# Patient Record
Sex: Female | Born: 1980
Health system: Southern US, Community
[De-identification: ages and names within clinical notes are randomized; demographics above are authoritative.]

## PROBLEM LIST (undated history)

## (undated) DIAGNOSIS — R011 Cardiac murmur, unspecified: Secondary | ICD-10-CM

## (undated) DIAGNOSIS — D649 Anemia, unspecified: Secondary | ICD-10-CM

## (undated) DIAGNOSIS — F32A Depression, unspecified: Secondary | ICD-10-CM

## (undated) DIAGNOSIS — K219 Gastro-esophageal reflux disease without esophagitis: Secondary | ICD-10-CM

## (undated) DIAGNOSIS — F329 Major depressive disorder, single episode, unspecified: Secondary | ICD-10-CM

## (undated) HISTORY — DX: Major depressive disorder, single episode, unspecified: F32.9

## (undated) HISTORY — DX: Depression, unspecified: F32.A

---

## 2000-08-09 ENCOUNTER — Other Ambulatory Visit: Admission: RE | Admit: 2000-08-09 | Discharge: 2000-08-09 | Payer: Self-pay | Admitting: Internal Medicine

## 2000-09-25 ENCOUNTER — Ambulatory Visit (HOSPITAL_COMMUNITY): Admission: RE | Admit: 2000-09-25 | Discharge: 2000-09-25 | Payer: Self-pay | Admitting: Infectious Diseases

## 2001-03-15 ENCOUNTER — Encounter: Admission: RE | Admit: 2001-03-15 | Discharge: 2001-03-15 | Payer: Self-pay | Admitting: Internal Medicine

## 2001-03-15 ENCOUNTER — Encounter: Payer: Self-pay | Admitting: Internal Medicine

## 2002-03-22 ENCOUNTER — Other Ambulatory Visit: Admission: RE | Admit: 2002-03-22 | Discharge: 2002-03-22 | Payer: Self-pay | Admitting: Internal Medicine

## 2002-12-21 ENCOUNTER — Encounter: Admission: RE | Admit: 2002-12-21 | Discharge: 2003-03-21 | Payer: Self-pay | Admitting: Occupational Medicine

## 2004-05-05 ENCOUNTER — Other Ambulatory Visit: Admission: RE | Admit: 2004-05-05 | Discharge: 2004-05-05 | Payer: Self-pay | Admitting: Obstetrics and Gynecology

## 2004-10-15 ENCOUNTER — Other Ambulatory Visit: Admission: RE | Admit: 2004-10-15 | Discharge: 2004-10-15 | Payer: Self-pay | Admitting: Obstetrics and Gynecology

## 2005-10-30 ENCOUNTER — Other Ambulatory Visit: Admission: RE | Admit: 2005-10-30 | Discharge: 2005-10-30 | Payer: Self-pay | Admitting: Obstetrics and Gynecology

## 2006-05-27 ENCOUNTER — Ambulatory Visit (HOSPITAL_COMMUNITY): Admission: RE | Admit: 2006-05-27 | Discharge: 2006-05-27 | Payer: Self-pay | Admitting: Obstetrics and Gynecology

## 2006-12-25 ENCOUNTER — Inpatient Hospital Stay (HOSPITAL_COMMUNITY): Admission: AD | Admit: 2006-12-25 | Discharge: 2006-12-25 | Payer: Self-pay | Admitting: Obstetrics and Gynecology

## 2006-12-26 ENCOUNTER — Inpatient Hospital Stay (HOSPITAL_COMMUNITY): Admission: AD | Admit: 2006-12-26 | Discharge: 2006-12-28 | Payer: Self-pay | Admitting: Obstetrics and Gynecology

## 2013-01-12 DIAGNOSIS — F419 Anxiety disorder, unspecified: Secondary | ICD-10-CM | POA: Insufficient documentation

## 2013-11-24 ENCOUNTER — Inpatient Hospital Stay: Payer: Self-pay

## 2013-11-24 LAB — CBC WITH DIFFERENTIAL/PLATELET
Basophil #: 0.1 10*3/uL (ref 0.0–0.1)
Basophil %: 0.5 %
Eosinophil #: 0.1 10*3/uL (ref 0.0–0.7)
Eosinophil %: 0.4 %
HCT: 37.2 % (ref 35.0–47.0)
HGB: 12.6 g/dL (ref 12.0–16.0)
Lymphocyte #: 1.3 10*3/uL (ref 1.0–3.6)
Lymphocyte %: 8 %
MCH: 30.3 pg (ref 26.0–34.0)
MCHC: 33.8 g/dL (ref 32.0–36.0)
MCV: 89 fL (ref 80–100)
Monocyte #: 1.2 x10 3/mm — ABNORMAL HIGH (ref 0.2–0.9)
Monocyte %: 7.1 %
Neutrophil #: 14 10*3/uL — ABNORMAL HIGH (ref 1.4–6.5)
Neutrophil %: 84 %
Platelet: 198 10*3/uL (ref 150–440)
RBC: 4.16 10*6/uL (ref 3.80–5.20)
RDW: 13.3 % (ref 11.5–14.5)
WBC: 16.7 10*3/uL — ABNORMAL HIGH (ref 3.6–11.0)

## 2013-11-26 LAB — HEMATOCRIT: HCT: 30.3 % — ABNORMAL LOW (ref 35.0–47.0)

## 2015-01-29 NOTE — H&P (Signed)
L&D Evaluation:  History Expanded:  HPI 34 yo G3P1011 at 40 weeks of pregnancy. based on a 7 week [place]. Neg harmony test pt is GBS neg/ Got tdap 12/26 got flu at pharmacy, has had a previous 71-14 female SVD, last labor was less than 3 hours, RUBI/VZI/OPOS   Gravida 3   Term 1   PreTerm 0   Abortion 1   Living 1   Blood Type (Maternal) O positive   Group B Strep Results Maternal (Result >5wks must be treated as unknown) negative   Maternal HIV Negative   Maternal Syphilis Ab Nonreactive   Maternal Varicella Immune   Rubella Results (Maternal) immune   Maternal T-Dap Immune   [facility] 24-Nov-2013   Presents with contractions   Patient's Medical History No Chronic Illness  HPV   Patient's Surgical History none   Medications Pre Natal Vitamins  zoloft 25 mg   Allergies NKDA   Social History none   Family History Non-Contributory   ROS:  ROS All systems were reviewed.  HEENT, CNS, GI, GU, Respiratory, CV, Renal and Musculoskeletal systems were found to be normal.   Exam:  Vital Signs stable   Urine Protein not completed   General no apparent distress   Mental Status clear   Chest clear   Heart normal sinus rhythm   Abdomen gravid, non-tender   Estimated Fetal Weight Small for gestational age   Fetal Position v   Fundal Height term   Back no CVAT   Edema no edema   Reflexes 1+   Pelvic no external lesions, 3 then changed to 5   Mebranes Intact   FHT normal rate with no decels, cat 1   Fetal Heart Rate 140   Ucx irregular   Skin dry   Lymph no lymphadenopathy   Impression:  Impression early labor   Plan:  Comments allow to labor and recheck   Follow Up Appointment need to schedule. in 6 weeks   Electronic Signatures: Erik Obey (MD)  (Signed 06-Mar-15 23:16)  Authored: L&D Evaluation   Last Updated: 06-Mar-15 23:16 by Erik Obey (MD)

## 2015-09-12 DIAGNOSIS — F329 Major depressive disorder, single episode, unspecified: Secondary | ICD-10-CM | POA: Insufficient documentation

## 2015-09-12 DIAGNOSIS — O99340 Other mental disorders complicating pregnancy, unspecified trimester: Secondary | ICD-10-CM | POA: Insufficient documentation

## 2015-09-12 DIAGNOSIS — F32A Depression, unspecified: Secondary | ICD-10-CM | POA: Insufficient documentation

## 2016-10-24 DIAGNOSIS — Z23 Encounter for immunization: Secondary | ICD-10-CM | POA: Diagnosis not present

## 2016-11-10 DIAGNOSIS — Z13220 Encounter for screening for lipoid disorders: Secondary | ICD-10-CM | POA: Diagnosis not present

## 2016-11-10 DIAGNOSIS — Z0000 Encounter for general adult medical examination without abnormal findings: Secondary | ICD-10-CM | POA: Diagnosis not present

## 2016-11-10 DIAGNOSIS — Z131 Encounter for screening for diabetes mellitus: Secondary | ICD-10-CM | POA: Diagnosis not present

## 2016-11-16 DIAGNOSIS — Z131 Encounter for screening for diabetes mellitus: Secondary | ICD-10-CM | POA: Diagnosis not present

## 2016-11-16 DIAGNOSIS — Z13220 Encounter for screening for lipoid disorders: Secondary | ICD-10-CM | POA: Diagnosis not present

## 2017-01-11 ENCOUNTER — Encounter: Payer: Self-pay | Admitting: Obstetrics and Gynecology

## 2017-01-11 ENCOUNTER — Ambulatory Visit (INDEPENDENT_AMBULATORY_CARE_PROVIDER_SITE_OTHER): Payer: 59 | Admitting: Obstetrics and Gynecology

## 2017-01-11 VITALS — BP 104/66 | HR 73 | Ht 62.0 in | Wt 132.0 lb

## 2017-01-11 DIAGNOSIS — Z3169 Encounter for other general counseling and advice on procreation: Secondary | ICD-10-CM

## 2017-01-11 NOTE — Patient Instructions (Signed)
Infertility Infertility is when you are unable to get pregnant (conceive) after a year of having sex regularly without using birth control. Infertility can also mean that a woman is not able to carry a pregnancy to full term. Both women and men can have fertility problems. What causes infertility? What Causes Infertility in Women?  There are many possible causes of infertility in women. For some women, the cause of infertility is not known (unexplained infertility). Infertility can also be linked to more than one cause. Infertility problems in women can be caused by problems with the menstrual cycle or reproductive organs, certain medical conditions, and factors related to lifestyle and age.  Problems with your menstrual cycle can interfere with your ovaries producing eggs (ovulation). This can make it difficult to get pregnant. This includes having a menstrual cycle that is very long, very short, or irregular.  Problems with reproductive organs can include:  An abnormally narrow cervix or a cervix that does not remain closed during a pregnancy.  A blockage in your fallopian tubes.  An abnormally shaped uterus.  Uterine fibroids. This is a tissue mass (tumor) that can develop on your uterus.  Medical conditions that can affect a woman's fertility include:  Polycystic ovarian syndrome (PCOS). This is a hormonal disorder that can cause small cysts to grow on your ovaries. This is the most common cause of infertility in women.  Endometriosis. This is a condition in which the tissue that lines your uterus (endometrium) grows outside of its normal location.  Primary ovary insufficiency. This is when your ovaries stop producing eggs and hormones before the age of 43.  Sexually transmitted diseases, such as chlamydia or gonorrhea. These infections can cause scarring in your fallopian tubes. This makes it difficult for eggs to reach your uterus.  Autoimmune disorders. These are disorders in  which your immune system attacks normal, healthy cells.  Hormone imbalances.  Other factors include:  Age. A woman's fertility declines with age, especially after her mid-26s.  Being under- or overweight.  Drinking too much alcohol.  Using drugs.  Exercising excessively.  Being exposed to environmental toxins, such as radiation, pesticides, and certain chemicals. What Causes Infertility in Men?  There are many causes of infertility in men. Infertility can be linked to more than one cause. Infertility problems in men can be caused by problems with sperm or the reproductive organs, certain medical conditions, and factors related to lifestyle and age. Some men have unexplained infertility.  Problems with sperm. Infertility can result if there is a problem producing:  Enough sperm (low sperm count).  Enough normally-shaped sperm (sperm morphology).  Sperm that are able to reach the egg (poor motility).  Infertility can also be caused by:  A problem with hormones.  Enlarged veins (varicoceles), cysts (spermatoceles), or tumors of the testicles.  Sexual dysfunction.  Injury to the testicles.  A birth defect, such as not having the tubes that carry sperm (vas deferens).  Medical conditions that can affect a man's fertility include:  Diabetes.  Cancer treatments, such as chemotherapy or radiation.  Klinefelter syndrome. This is an inherited genetic disorder.  Thyroid problems, such as an under- or overactive thyroid.  Cystic fibrosis.  Sexually transmitted diseases.  Other factors include:  Age. A man's fertility declines with age.  Drinking too much alcohol.  Using drugs.  Being exposed to environmental toxins, such as pesticides and lead. What are the symptoms of infertility? Being unable to get pregnant after one year of having regular  sex without using birth control is the only sign of infertility. How is infertility diagnosed? In order to be diagnosed  with infertility, both partners will have a physical exam. Both partners will also have an extensive medical and sexual history taken. If there is no obvious reason for infertility, additional tests may be done. What Tests Will Women Have?  Women may first have tests to check whether they are ovulating each month. The tests may include:  Blood tests to check hormone levels.  An ultrasound of the ovaries. This looks for possible problems on or in the ovaries.  Taking a small sample of the tissue that lines the uterus for examination under a microscope (endometrial biopsy). Women who are ovulating may have additional tests. These may include:  Hysterosalpingography.  This is an X-ray of the fallopian tubes and uterus taken after a specific type of dye is injected.  This test can show the shape of the uterus and whether the fallopian tubes are open.  Laparoscopy.  In this test, a lighted tube (laparoscope) is used to look for problems in the fallopian tubes and other female organs.  Transvaginal ultrasound.  This is an imaging test to check for abnormalities of the uterus and ovaries.  A health care provider can use this test to count the number of follicles on the ovaries.  Hysteroscopy.  This test involves using a lighted tube to examine the cervix and inside the uterus.  It is done to find any abnormalities inside the uterus. What Tests Will Men Have?  Tests for men's infertility includes:  Semen tests to check sperm count, morphology, and motility.  Blood tests to check for hormone levels.  Taking a small sample of tissue from inside a testicle (biopsy). This is examined under a microscope.  Blood tests to check for genetic abnormalities (genetic testing). How are women treated for infertility? Treatment depends on the cause of infertility. Most cases of infertility in women are treated with medicine or surgery.  Women may take medicine to:  Correct ovulation  problems.  Treat other health conditions, such as PCOS.  Surgery may be done to:  Repair damage to the ovaries, fallopian tubes, cervix, or uterus.  Remove growths from the uterus.  Remove scar tissue from the uterus, pelvis, or other female organs. How are men treated for infertility? Treatment depends on the cause of infertility. Most cases of infertility in men are treated with medicine or surgery.  Men may take medicine to:  Correct hormone problems.  Treat other health conditions.  Treat sexual dysfunction.  Surgery may be done to:  Remove blockages in the reproductive tract.  Correct other structural problems of the reproductive tract. What is assisted reproductive technology? Assisted reproductive technology (ART) refers to all treatments and procedures that combine eggs and sperm outside the body to try to help a couple conceive. ART is often combined with fertility drugs to stimulate ovulation. Sometimes ART is done using eggs retrieved from another woman's body (donor eggs) or from previously frozen fertilized eggs (embryos). There are different types of ART. These include:  Intrauterine insemination (IUI).  In this procedure, sperm is placed directly into a woman's uterus with a long, thin tube.  This may be most effective for infertility caused by sperm problems, including low sperm count and low motility.  Can be used in combination with fertility drugs.  In vitro fertilization (IVF).  This is often done when a woman's fallopian tubes are blocked or when a man has  low sperm counts.  Fertility drugs stimulate the ovaries to produce multiple eggs. Once mature, these eggs are removed from the body and combined with the sperm to be fertilized.  These fertilized eggs are then placed in the woman's uterus. This information is not intended to replace advice given to you by your health care provider. Make sure you discuss any questions you have with your health care  provider. Document Released: 09/10/2003 Document Revised: 02/07/2016 Document Reviewed: 05/23/2014 Elsevier Interactive Patient Education  2017 [employer].

## 2017-01-14 NOTE — Progress Notes (Signed)
Gynecology H&P  PCP: No PCP Per Patient  Chief Complaint:  Chief Complaint  Patient presents with  . discuss infertility    History of Present Illness: Patient is a 36 y.o. O1H0865 presenting for evaluation of infertility. Patient and partner have been attempting conception for 6 months. Marital Status: maried Pregnancies with current partner yes  Sexual History Frequency: 3 or 4 times per week(s) Satisfied: not applicable Dyspareunia: no Use of Lubricant: no Douching: no  Ovulatory Evaluation LMP: Patient's last menstrual period was 12/29/2016 (exact date). Average Interval: 30  days Duration of flow: 5 days Heavy Menses: no Clots: no [place] yes Ovulation Predictor Kits Positive  unknown Intermenstrual Bleeding: no Dysmenorrhea: no Amenorrhea: not applicable Wt Change: no Hirsutism: no Balding: no Acne: no Galactorrhea: no Hot Flashes: no Meds:none Other Therapies: Not applicable  Tubal Factor Previous abdominal or pelvic surgery: no Pelvic Pain:  no Endometriosis: no STD: no PID: no Laparoscopy: no Prior HSG: no  Female Factor Sired prior conception:  yes Semen analysis: no  Contraception None  Contributing Habits Cigarettes:    Wife -  no    Husband - no Alcohol:    Wife -  no    Husband - no  No family history of autism, MR, or fragile X.  No complications during prior pregnancies or deliveries.    Review of Systems: Review of Systems  Constitutional: Negative for chills, fever and weight loss.  HENT: Negative for congestion.   Eyes: Negative for blurred vision.  Respiratory: Negative for cough and shortness of breath.   Cardiovascular: Negative for chest pain and palpitations.  Gastrointestinal: Negative for abdominal pain, constipation, diarrhea, heartburn, nausea and vomiting.  Genitourinary: Negative for dysuria, frequency and urgency.  Skin: Negative for itching and rash.  Neurological: Negative for dizziness and headaches.    Endo/Heme/Allergies: Negative for polydipsia.  Psychiatric/Behavioral: Negative for depression.    Past Medical History:  Past Medical History:  Diagnosis Date  . Depression     Past Surgical History:  History reviewed. No pertinent surgical history.   Family History:  Family History  Problem Relation Age of Onset  . Skin cancer Mother 45  . Alzheimer's disease Maternal Grandmother   . Prostate cancer Maternal Grandfather   . Colon cancer Paternal Grandfather    Thyroid Problems: no Heart Condition or High Blood Pressure: no Blood Clot or Stroke: no Diabetes: no Cancer: no Birth Defects/Inherited diseases:no Infectious diseases (mumps, TB, Rubella):no Other Medical Problems: no MR/autism/fragile X or POF: no   Social History:  Social History   Social History  . Marital status: Married    Spouse name: N/A  . Number of children: N/A  . Years of education: N/A   Occupational History  . Not on file.   Social History Main Topics  . Smoking status: Former Research scientist (life sciences)  . Smokeless tobacco: Never Used  . Alcohol use 0.6 oz/week    1 Glasses of wine per week  . Drug use: No  . Sexual activity: Yes    Partners: Male   Other Topics Concern  . Not on file   Social History Narrative  . No narrative on file    Allergies:  No Known Allergies  Medications: Prior to Admission medications   Medication Sig Start Date End Date Taking? Authorizing Provider  Prenatal Vit-Fe Fumarate-FA (MULTIVITAMIN-PRENATAL) 27-0.8 MG TABS tablet Take 1 tablet by mouth daily at 12 noon.   Yes Historical Provider, MD  sertraline (ZOLOFT) 100 MG tablet  Take 100 mg by mouth daily.   Yes Historical Provider, MD  fluticasone (FLONASE) 50 MCG/ACT nasal spray Place into the nose.    Historical Provider, MD    Physical Exam Vitals: Blood pressure 104/66, pulse 73, height 5\' 2"  (1.575 m), weight 132 lb (59.9 kg), last menstrual period 12/29/2016. General: NAD HEENT: normocephalic,  anicteric Pulmonary: No increased work of breathing Extremities: no edema, erythema, or tenderness Neurologic: Grossly intact Psychiatric: mood appropriate, affect full  Assessment: 36 y.o. [id number] with infertility Plan: 1) We discussed the underlying etiologies which may be implicated in a couple experiencing difficulty conceiving.  The average couple will conceive within the span of 1 year with unprotected coitus, with a monthly fecundity rate of 20% or 1 in 5.  Even without further work up or intervention the patient and her partner may be successful in conceiving unassisted, although if an underlying etiology can be identified and addressed fecundity rate may improve.  The work up entails examining for ovulatory function, tubal patency, and ruling out female factor infertility.  These may be looked at concurrently or sequentially.  The downside of sequential work up is that this method may miss issues if more than one compartment is contributing.  She is aware that tubal factor or moderate to severe female factor infertility will require further consultation with a reproductive endocrinologist.  In the case of anovulation, use of Clomid (clomiphen citrate) or Femara (letrazole) were discussed with the understanding the the later is an off-label, but well supported use.  With either of these drugs the risk of multiples increases from the standard population rate of 2% to approximately 10%, with higher order multiples possible but unlikely.  Both drugs may require some time to titrate to the appropriate dosage to ensure consistent ovulation.  Cycles will be limited to 6 cycles on each drug secondary to decreasing rates of conception after 6 cycles.  In addition should patient be started on ovulation induction with Clomid she was advised to discontinue the drug for any vision changes as this is a rare but potentially permanent side-effect if medication is continued.  We discussed timing of intercourse as well  as the use of ovulation predictor kits identify the patient's fertile window each month.     2) Preconception counseling  - immunization up to date.  The patient denies any family history of conditions which would warrant preconception genetic counseling or testing on her or her partner.  Instructed to start prenatal vitamins while trying to conceive.    3) Given age will also obtain AMH  4) A total of 25 minutes were spent in face-to-face contact with the patient during this encounter with over half of that time devoted to counseling and coordination of care.

## 2017-01-16 LAB — TSH+PRL+FSH+TESTT+LH+DHEA S...
17-Hydroxyprogesterone: 21 ng/dL
Androstenedione: 76 ng/dL (ref 41–262)
DHEA-SO4: 142.6 ug/dL (ref 57.3–279.2)
FSH: 4 m[IU]/mL
LH: 5.5 m[IU]/mL
Prolactin: 18.5 ng/mL (ref 4.8–23.3)
TSH: 0.706 u[IU]/mL (ref 0.450–4.500)
Testosterone, Free: 1.4 pg/mL (ref 0.0–4.2)
Testosterone: <3 ng/dL — ABNORMAL LOW (ref 8–48)

## 2017-01-16 LAB — ANTI MULLERIAN HORMONE: ANTI-MULLERIAN HORMONE (AMH): 1.42 ng/mL

## 2017-01-18 ENCOUNTER — Other Ambulatory Visit: Payer: 59

## 2017-01-18 DIAGNOSIS — Z3169 Encounter for other general counseling and advice on procreation: Secondary | ICD-10-CM

## 2017-01-19 ENCOUNTER — Encounter: Payer: Self-pay | Admitting: Obstetrics and Gynecology

## 2017-01-19 LAB — PROGESTERONE: Progesterone: 7.4 ng/mL

## 2017-02-03 ENCOUNTER — Telehealth: Payer: Self-pay | Admitting: Obstetrics and Gynecology

## 2017-02-03 NOTE — Telephone Encounter (Signed)
Pt is calling today reporting she is on her Menses. Pt is asking for the order for an Semen Analysis to be sent to [place] for her Husband. Please advise [phone]

## 2017-02-04 ENCOUNTER — Encounter: Payer: Self-pay | Admitting: Obstetrics and Gynecology

## 2017-02-04 DIAGNOSIS — Z3169 Encounter for other general counseling and advice on procreation: Secondary | ICD-10-CM

## 2017-02-04 NOTE — Telephone Encounter (Signed)
Andreah aware, via voicemail, kit is at front desk ready for pick up.

## 2017-02-04 NOTE — Telephone Encounter (Signed)
Can we get her husband a collection kit and instruction to pick up at the office

## 2017-02-04 NOTE — Progress Notes (Signed)
Infertility - NOS - LMP 12/29/16 normal labs, normal AMH, ovulatory progesterone - LMP 02/03/17 - proceed with semen analysis

## 2017-03-11 ENCOUNTER — Encounter: Payer: Self-pay | Admitting: Obstetrics and Gynecology

## 2017-03-12 ENCOUNTER — Telehealth: Payer: Self-pay | Admitting: Obstetrics and Gynecology

## 2017-03-12 NOTE — Telephone Encounter (Signed)
Please advise. Thank you

## 2017-03-12 NOTE — Telephone Encounter (Signed)
HSG scheduling

## 2017-03-12 NOTE — Telephone Encounter (Signed)
Morgan Sullivan was advised to call on the first day of her next period for the dye test. Pt stated that she is currently on her cycle and she started on Tuesday 03/09/17. Pt wanted to know if it was too late to do the test now or if she needed to wait until her next cycle. Please advise. Thanks TNP

## 2017-03-15 ENCOUNTER — Other Ambulatory Visit: Payer: Self-pay | Admitting: Obstetrics and Gynecology

## 2017-03-15 ENCOUNTER — Telehealth: Payer: Self-pay | Admitting: Obstetrics and Gynecology

## 2017-03-15 DIAGNOSIS — N979 Female infertility, unspecified: Secondary | ICD-10-CM

## 2017-03-15 NOTE — Telephone Encounter (Signed)
Patient is aware of appointment, was given directions to the [facility], and was given her deductible, coinsurance, and out-of-pocket info.

## 2017-03-15 NOTE — Telephone Encounter (Signed)
Patient is scheduled for 03/16/17 @ 1:30pm, and should arrive @ 1:15pm at the [facility].

## 2017-03-15 NOTE — Telephone Encounter (Signed)
-----   Message from Malachy Mood, MD sent at 03/12/2017  5:05 PM EDT ----- Regarding: HSG Surgery Date: 03/16/17  LOS: outpatient  Surgery Booking Request Patient Full Name: Morgan, Sullivan MRN: [id number]  DOB: 23-Sep-1980  Surgeon: Malachy Mood, MD  Requested Surgery Date and Time: 03/16/17 Primary Diagnosis and Code: Infertility Secondary Diagnosis and Code:  Surgical Procedure: HSG Admission Status: outpatient Length of Surgery: N./A

## 2017-03-16 ENCOUNTER — Ambulatory Visit
Admission: RE | Admit: 2017-03-16 | Discharge: 2017-03-16 | Disposition: A | Discharge status: Home / Self Care | Payer: 59 | Source: Ambulatory Visit | Attending: Obstetrics and Gynecology | Admitting: Obstetrics and Gynecology

## 2017-03-16 DIAGNOSIS — N979 Female infertility, unspecified: Secondary | ICD-10-CM | POA: Diagnosis not present

## 2017-03-16 DIAGNOSIS — N971 Female infertility of tubal origin: Secondary | ICD-10-CM | POA: Diagnosis not present

## 2017-03-16 MED ORDER — IOPAMIDOL (ISOVUE-370) INJECTION 76%
50.0000 mL | Freq: Once | INTRAVENOUS | Status: AC | PRN
  Administered 2017-03-16: 5 mL

## 2017-03-16 NOTE — Procedures (Signed)
The patient was positioned in the dorsal lithotomy position.  Sterile speculum was placed to allow visualization of the cervix.  The cervix was prepped with betadine.  The HSG catheter which had been previously primed was passed without difficulty though the cervix into the lower uterine segment, catheter balloon was inflated.  The sterile speculum was removed.  9mL of Isoview 370 were pushed noting normal uterine cavity, normal course and caliber of both fallopian tubes, and bilateral spill of contrast.  The cathter balloon was deflated and the catheter removed.  The patient tolerated the procedure well, findings were communicated to the patient at the conclusion.

## 2017-03-31 DIAGNOSIS — Z0184 Encounter for antibody response examination: Secondary | ICD-10-CM | POA: Diagnosis not present

## 2017-04-01 ENCOUNTER — Encounter: Payer: Self-pay | Admitting: Obstetrics and Gynecology

## 2017-04-06 DIAGNOSIS — Z23 Encounter for immunization: Secondary | ICD-10-CM | POA: Diagnosis not present

## 2017-04-08 ENCOUNTER — Encounter: Payer: Self-pay | Admitting: Obstetrics and Gynecology

## 2017-04-08 ENCOUNTER — Other Ambulatory Visit: Payer: Self-pay | Admitting: Obstetrics and Gynecology

## 2017-04-08 DIAGNOSIS — Z319 Encounter for procreative management, unspecified: Secondary | ICD-10-CM

## 2017-04-08 MED ORDER — CLOMIPHENE CITRATE 50 MG PO TABS: 50.0000 mg | ORAL_TABLET | Freq: Every day | ORAL | 0 refills | Status: DC

## 2017-04-08 NOTE — Progress Notes (Signed)
LMP 04/08/17 start Cycle I clomid 50mg , day 21 progesterone 04/28/17

## 2017-06-02 ENCOUNTER — Ambulatory Visit (INDEPENDENT_AMBULATORY_CARE_PROVIDER_SITE_OTHER): Payer: 59 | Admitting: Obstetrics and Gynecology

## 2017-06-02 ENCOUNTER — Encounter: Payer: Self-pay | Admitting: Obstetrics and Gynecology

## 2017-06-02 VITALS — BP 108/60 | Wt 122.0 lb

## 2017-06-02 DIAGNOSIS — N912 Amenorrhea, unspecified: Secondary | ICD-10-CM

## 2017-06-02 DIAGNOSIS — F32A Depression, unspecified: Secondary | ICD-10-CM

## 2017-06-02 DIAGNOSIS — O09529 Supervision of elderly multigravida, unspecified trimester: Secondary | ICD-10-CM | POA: Insufficient documentation

## 2017-06-02 DIAGNOSIS — Z124 Encounter for screening for malignant neoplasm of cervix: Secondary | ICD-10-CM

## 2017-06-02 DIAGNOSIS — O09521 Supervision of elderly multigravida, first trimester: Secondary | ICD-10-CM

## 2017-06-02 DIAGNOSIS — O0990 Supervision of high risk pregnancy, unspecified, unspecified trimester: Secondary | ICD-10-CM | POA: Insufficient documentation

## 2017-06-02 DIAGNOSIS — O0991 Supervision of high risk pregnancy, unspecified, first trimester: Secondary | ICD-10-CM

## 2017-06-02 DIAGNOSIS — O99340 Other mental disorders complicating pregnancy, unspecified trimester: Secondary | ICD-10-CM

## 2017-06-02 DIAGNOSIS — Z113 Encounter for screening for infections with a predominantly sexual mode of transmission: Secondary | ICD-10-CM

## 2017-06-02 DIAGNOSIS — Z3A01 Less than 8 weeks gestation of pregnancy: Secondary | ICD-10-CM | POA: Insufficient documentation

## 2017-06-02 DIAGNOSIS — F329 Major depressive disorder, single episode, unspecified: Secondary | ICD-10-CM

## 2017-06-02 LAB — POCT URINE PREGNANCY: Preg Test, Ur: POSITIVE — AB

## 2017-06-02 LAB — HM PAP SMEAR: HM Pap smear: NEGATIVE

## 2017-06-02 NOTE — Progress Notes (Signed)
New Obstetric Patient H&P   Chief Complaint: "Desires prenatal care"   History of Present Illness: Patient is a 36 y.o. [id number] Not Hispanic or Latino female, LMP 04/09/17 (exact) presents with amenorrhea and positive home pregnancy test. Based on her  LMP, her EDD is Estimated Date of Delivery: 01/14/18 and her EGA is [redacted]w[redacted]d. Cycles are regular and last for 5 days occurring every 28 days.  She took one round of Clomid to achieve this pregnancy.  Her last pap smear was several months ago and was normal.    Her last menstrual period was normal and lasted for  5 day(s). Since her LMP she claims she has experienced nausea. She denies vaginal bleeding. Her past medical history is notable for depression. Her prior pregnancies are notable for no complications (precipitous delivery with G1).  Since her LMP, she admits to the use of tobacco products  no She claims she has gained   zero pounds since the start of her pregnancy.  There are cats in the home in the home  yes  She admits close contact with children on a regular basis  yes  She has had chicken pox in the past yes She has had Tuberculosis exposures, symptoms, or previously tested positive for TB   no Current or past history of domestic violence. no  Genetic Screening/Teratology Counseling: (Includes patient, baby's father, or anyone in either family with:)   25. Patient's age >/= 28 at [facility]  yes 2. Thalassemia ([place], [place], [place], or Asian background): MCV<80  no 3. Neural tube defect (meningomyelocele, spina bifida, anencephaly)  no 4. Congenital heart defect  no  5. Down syndrome  no 6. Tay-Sachs (Jewish, [place])  no 7. Canavan's Disease  no 8. Sickle cell disease or trait (African)  no  9. Hemophilia or other blood disorders  no  10. Muscular dystrophy  no  11. Cystic fibrosis  no  12. Huntington's Chorea  no  13. Mental retardation/autism  no 14. Other inherited genetic or chromosomal disorder  no 15. Maternal  metabolic disorder (DM, PKU, etc)  no 16. Patient or FOB with a child with a birth defect not listed above no  16a. Patient or FOB with a birth defect themselves no 17. Recurrent pregnancy loss, or stillbirth  no  18. Any medications since LMP other than prenatal vitamins (include vitamins, supplements, OTC meds, drugs, alcohol)  no 19. Any other genetic/environmental exposure to discuss  Yes. Zoloft for depression  Infection History:   1. Lives with someone with TB or TB exposed  no  2. Patient or partner has history of genital herpes  no 3. Rash or viral illness since LMP  no 4. History of STI (GC, CT, HPV, syphilis, HIV)  no 5. History of recent travel :  no  Other pertinent information:  no     Review of Systems:10 point review of systems negative unless otherwise noted in HPI  Past Medical History:  Diagnosis Date  . Depression     History reviewed. No pertinent surgical history.  Gynecologic History: Patient's last menstrual period was 04/09/2017 (exact date).  Obstetric History: [id number]  Family History  Problem Relation Age of Onset  . Skin cancer Mother 1  . Alzheimer's disease Maternal Grandmother   . Prostate cancer Maternal Grandfather   . Colon cancer Paternal Grandfather     Social History   Social History  . Marital status: Married    Spouse name: N/A  . Number of children: N/A  .  Years of education: N/A   Occupational History  . Not on file.   Social History Main Topics  . Smoking status: Former Research scientist (life sciences)  . Smokeless tobacco: Never Used  . Alcohol use 0.6 oz/week    1 Glasses of wine per week  . Drug use: No  . Sexual activity: Yes    Partners: Male   Other Topics Concern  . Not on file   Social History Narrative  . No narrative on file    No Known Allergies  Prior to Admission medications   Medication Sig Start Date End Date Taking? Authorizing Provider  Prenatal Vit-Fe Fumarate-FA (MULTIVITAMIN-PRENATAL) 27-0.8 MG TABS tablet Take  1 tablet by mouth daily at 12 noon.   Yes [provider]  sertraline (ZOLOFT) 100 MG tablet Take 100 mg by mouth daily.   Yes [provider]    Physical Exam BP 108/60   Wt 122 lb (55.3 kg)   LMP 04/09/2017 (Exact Date)   BMI 22.31 kg/m   Physical Exam  Constitutional: She is oriented to person, place, and time. She appears well-developed and well-nourished. No distress.  HENT:  Head: Normocephalic and atraumatic.  Eyes: Conjunctivae are normal.  Neck: Normal range of motion. Neck supple. No thyromegaly present.  Cardiovascular: Normal rate, regular rhythm and normal heart sounds.  Exam reveals no gallop and no friction rub.   No murmur heard. Pulmonary/Chest: Effort normal and breath sounds normal. She has no wheezes.  Abdominal: Soft. She exhibits no distension and no mass. There is no tenderness. There is no rebound and no guarding. No hernia. Hernia confirmed negative in the right inguinal area and confirmed negative in the left inguinal area.  Genitourinary: Pelvic exam was performed with patient supine. There is no rash, tenderness or lesion on the right labia. There is no rash, tenderness or lesion on the left labia.  Musculoskeletal: Normal range of motion.  Lymphadenopathy:       Right: No inguinal adenopathy present.       Left: No inguinal adenopathy present.  Neurological: She is alert and oriented to person, place, and time.  Skin: Skin is warm and dry. No rash noted.  Psychiatric: She has a normal mood and affect. Her behavior is normal.    Female Chaperone present during breast and/or pelvic exam.   Assessment: 36 y.o. [id number] at [redacted]w[redacted]d presenting to initiate prenatal care  Plan: 1) Avoid alcoholic beverages. 2) Patient encouraged not to smoke.  3) Discontinue the use of all non-medicinal drugs and chemicals.  4) Take prenatal vitamins daily.  5) Nutrition, food safety (fish, cheese advisories, and high nitrite foods) and exercise discussed. 6)  Hospital and practice style discussed with cross coverage system.  7) Genetic Screening, such as with 1st Trimester Screening, cell free fetal DNA, AFP testing, and Ultrasound, as well as with amniocentesis and CVS as appropriate, is discussed with patient. At the conclusion of today's visit patient requested genetic testing 8) Patient is asked about travel to areas at risk for the Zika virus, and counseled to avoid travel and exposure to mosquitoes or sexual partners who may have themselves been exposed to the virus. Testing is discussed, and will be ordered as appropriate.   Prentice Docker, MD 06/02/2017 9:45 AM

## 2017-06-03 LAB — RPR+RH+ABO+RUB AB+AB SCR+CB...
ANTIBODY SCREEN: NEGATIVE
HEMATOCRIT: 37.9 % (ref 34.0–46.6)
HEMOGLOBIN: 13.3 g/dL (ref 11.1–15.9)
HEP B S AG: NEGATIVE
HIV Screen 4th Generation wRfx: NONREACTIVE
MCH: 30 pg (ref 26.6–33.0)
MCHC: 35.1 g/dL (ref 31.5–35.7)
MCV: 85 fL (ref 79–97)
Platelets: 246 10*3/uL (ref 150–379)
RBC: 4.44 x10E6/uL (ref 3.77–5.28)
RDW: 13.9 % (ref 12.3–15.4)
RH TYPE: POSITIVE
RPR Ser Ql: NONREACTIVE
Rubella Antibodies, IGG: 2.12 index (ref >0.99)
Varicella zoster IgG: 183 index (ref >165)
WBC: 5.9 10*3/uL (ref 3.4–10.8)

## 2017-06-03 LAB — URINE CULTURE

## 2017-06-08 LAB — IGP,CTNG,APTIMAHPV,RFX16/18,45
Chlamydia, Nuc. Acid Amp: NEGATIVE
Gonococcus by Nucleic Acid Amp: NEGATIVE
HPV Aptima: NEGATIVE
PAP SMEAR COMMENT: 0

## 2017-06-09 ENCOUNTER — Encounter: Payer: 59 | Admitting: Maternal Newborn

## 2017-06-09 ENCOUNTER — Other Ambulatory Visit: Payer: 59

## 2017-06-11 ENCOUNTER — Ambulatory Visit (INDEPENDENT_AMBULATORY_CARE_PROVIDER_SITE_OTHER): Payer: 59 | Admitting: Obstetrics and Gynecology

## 2017-06-11 ENCOUNTER — Ambulatory Visit (INDEPENDENT_AMBULATORY_CARE_PROVIDER_SITE_OTHER): Payer: 59

## 2017-06-11 VITALS — BP 110/68 | Wt 130.0 lb

## 2017-06-11 DIAGNOSIS — F329 Major depressive disorder, single episode, unspecified: Secondary | ICD-10-CM

## 2017-06-11 DIAGNOSIS — Z362 Encounter for other antenatal screening follow-up: Secondary | ICD-10-CM | POA: Diagnosis not present

## 2017-06-11 DIAGNOSIS — F419 Anxiety disorder, unspecified: Secondary | ICD-10-CM

## 2017-06-11 DIAGNOSIS — F32A Depression, unspecified: Secondary | ICD-10-CM

## 2017-06-11 DIAGNOSIS — Z3A09 9 weeks gestation of pregnancy: Secondary | ICD-10-CM

## 2017-06-11 DIAGNOSIS — O99340 Other mental disorders complicating pregnancy, unspecified trimester: Secondary | ICD-10-CM

## 2017-06-11 DIAGNOSIS — O219 Vomiting of pregnancy, unspecified: Secondary | ICD-10-CM

## 2017-06-11 DIAGNOSIS — O0991 Supervision of high risk pregnancy, unspecified, first trimester: Secondary | ICD-10-CM

## 2017-06-11 DIAGNOSIS — O09521 Supervision of elderly multigravida, first trimester: Secondary | ICD-10-CM

## 2017-06-11 MED ORDER — DOXYLAMINE-PYRIDOXINE ER 20-20 MG PO TBCR: 1.0000 | EXTENDED_RELEASE_TABLET | Freq: Two times a day (BID) | ORAL | 2 refills | Status: DC

## 2017-06-11 NOTE — Progress Notes (Signed)
Routine Prenatal Care Visit  Subjective  Morgan Sullivan is a 35 y.o. [id number] at [redacted]w[redacted]d being seen today for ongoing prenatal care.  She is currently monitored for the following issues for this high-risk pregnancy and has Depression affecting pregnancy; Anxiety disorder affecting pregnancy, antepartum; Supervision of high-risk pregnancy; Supervision of elderly multigravida in first trimester; [redacted] weeks gestation of pregnancy; and Nausea and vomiting during pregnancy on her problem list.  ----------------------------------------------------------------------------------- Patient reports no complaints.    . Vag. Bleeding: None.   . Denies leaking of fluid.  U/S confirms EDD today.  Desires NIPT. Return to clinic 1 week to obtain. Rx Lyla Son as is working well for her.   Baseline EPDS today = 3 ----------------------------------------------------------------------------------- The following portions of the patient's history were reviewed and updated as appropriate: allergies, current medications, past family history, past medical history, past social history, past surgical history and problem list. Problem list updated.  Objective  Blood pressure 110/68, weight 130 lb (59 kg), last menstrual period 04/09/2017. Pregravid weight 127 lb (57.6 kg) Total Weight Gain 3 lb (1.361 kg) Urinalysis: Urine Protein: Negative Urine Glucose: Negative  Fetal Status: Fetal Heart Rate (bpm): Present         General:  Alert, oriented and cooperative. Patient is in no acute distress.  Skin: Skin is warm and dry. No rash noted.   Cardiovascular: Normal heart rate noted  Respiratory: Normal respiratory effort, no problems with respiration noted  Abdomen: Soft, gravid, appropriate for gestational age. Pain/Pressure: Absent     Pelvic:  Cervical exam deferred        Extremities: Normal range of motion.     Mental Status: Normal mood and affect. Normal behavior. Normal judgment and thought content.   Assessment    36 y.o. [id number] at [redacted]w[redacted]d by  01/14/2018, by Last Menstrual Period presenting for routine prenatal visit  Plan   pregnancy#4 Problems (from 04/09/17 to present)    Problem Noted Resolved   Nausea and vomiting during pregnancy 06/11/2017 by Will Bonnet, MD No   Overview Signed 06/11/2017  1:49 PM by Will Bonnet, MD    [x]  Rx [place]      Supervision of high-risk pregnancy 06/02/2017 by Will Bonnet, MD No   Overview Addendum 06/11/2017  1:50 PM by Will Bonnet, MD    Clinic [facility] Prenatal Labs  Dating LMP=9 Blood type: O/Positive/-- (09/12 1023)   Genetic Screen AFP:     NIPS:[ ]  desires Antibody:Negative (09/12 1023)  Anatomic [place]  Rubella: 2.12 (09/12 1023) Varicella:    GTT Early:               Third trimester:  RPR: Non Reactive (09/12 1023)   Rhogam  HBsAg: Negative (09/12 1023)   TDaP vaccine                       Flu Shot: HIV:   NEG  Baby Food                                GBS:   Contraception  Pap:  CBB     CS/VBAC    Support Person             Supervision of elderly multigravida in first trimester 06/02/2017 by Will Bonnet, MD No   [redacted] weeks gestation of pregnancy 06/02/2017 by Will Bonnet, MD No   Depression affecting pregnancy  09/12/2015 by Will Bonnet, MD No   Overview Addendum 06/11/2017  1:49 PM by Will Bonnet, MD    Taking zoloft 100 mg Baseline EPDS = 3     Anxiety disorder affecting pregnancy, antepartum 01/12/2013 by Will Bonnet, MD No      Please refer to After Visit Summary for other counseling recommendations.   Return in about 4 weeks (around 07/09/2017) for schedule lab appt for 1 week for NIPT, also Routine Prenatal Appointment.  Prentice Docker, MD  06/11/2017 1:51 PM

## 2017-06-14 DIAGNOSIS — Z23 Encounter for immunization: Secondary | ICD-10-CM | POA: Diagnosis not present

## 2017-06-18 ENCOUNTER — Other Ambulatory Visit: Payer: Self-pay | Admitting: Obstetrics and Gynecology

## 2017-06-18 ENCOUNTER — Other Ambulatory Visit: Payer: 59

## 2017-06-18 DIAGNOSIS — O09521 Supervision of elderly multigravida, first trimester: Secondary | ICD-10-CM

## 2017-06-18 DIAGNOSIS — Z319 Encounter for procreative management, unspecified: Secondary | ICD-10-CM

## 2017-06-25 ENCOUNTER — Telehealth: Payer: Self-pay | Admitting: Obstetrics and Gynecology

## 2017-06-25 DIAGNOSIS — O285 Abnormal chromosomal and genetic finding on antenatal screening of mother: Secondary | ICD-10-CM | POA: Insufficient documentation

## 2017-06-25 LAB — MATERNIT 21 PLUS CORE, BLOOD
CHROMOSOME 13: NEGATIVE
CHROMOSOME 18: NEGATIVE
CHROMOSOME 21: POSITIVE — AB
Y CHROMOSOME: NOT DETECTED

## 2017-06-25 NOTE — Telephone Encounter (Signed)
Spoke with patient and discussed positive T21 MaterniT21 results, with PPV of >92.4%.   Offered genetic counseling and referral to [place] for possible diagnostic testing CVS/Amniocentesis. She agreed to referral.  All questions answered.

## 2017-06-25 NOTE — Telephone Encounter (Signed)
Left generic VM to call me back directly on my cell phone.  Number provided.

## 2017-06-28 ENCOUNTER — Other Ambulatory Visit: Payer: Self-pay | Admitting: Obstetrics and Gynecology

## 2017-06-28 DIAGNOSIS — Z369 Encounter for antenatal screening, unspecified: Secondary | ICD-10-CM

## 2017-07-01 ENCOUNTER — Telehealth: Payer: Self-pay | Admitting: Obstetrics & Gynecology

## 2017-07-01 ENCOUNTER — Ambulatory Visit
Admission: RE | Admit: 2017-07-01 | Discharge: 2017-07-01 | Disposition: A | Discharge status: Home / Self Care | Payer: 59 | Source: Ambulatory Visit | Attending: Obstetrics & Gynecology | Admitting: Obstetrics & Gynecology

## 2017-07-01 ENCOUNTER — Ambulatory Visit: Payer: 59

## 2017-07-01 DIAGNOSIS — F419 Anxiety disorder, unspecified: Secondary | ICD-10-CM

## 2017-07-01 DIAGNOSIS — Z369 Encounter for antenatal screening, unspecified: Secondary | ICD-10-CM | POA: Insufficient documentation

## 2017-07-01 DIAGNOSIS — O0991 Supervision of high risk pregnancy, unspecified, first trimester: Secondary | ICD-10-CM

## 2017-07-01 DIAGNOSIS — O285 Abnormal chromosomal and genetic finding on antenatal screening of mother: Secondary | ICD-10-CM

## 2017-07-01 DIAGNOSIS — O99340 Other mental disorders complicating pregnancy, unspecified trimester: Secondary | ICD-10-CM

## 2017-07-01 DIAGNOSIS — O219 Vomiting of pregnancy, unspecified: Secondary | ICD-10-CM

## 2017-07-01 DIAGNOSIS — Z3A11 11 weeks gestation of pregnancy: Secondary | ICD-10-CM | POA: Insufficient documentation

## 2017-07-01 DIAGNOSIS — F329 Major depressive disorder, single episode, unspecified: Secondary | ICD-10-CM

## 2017-07-01 DIAGNOSIS — O09521 Supervision of elderly multigravida, first trimester: Secondary | ICD-10-CM

## 2017-07-01 DIAGNOSIS — Z3A01 Less than 8 weeks gestation of pregnancy: Secondary | ICD-10-CM

## 2017-07-01 NOTE — Telephone Encounter (Signed)
-----   Message from Quintella Baton, [place] sent at 07/01/2017  2:43 PM EDT ----- Regarding: FW: Appt   ----- Message ----- From: Gae Dry, MD Sent: 07/01/2017   2:19 PM To: Quintella Baton, CMA, Otila Kluver, LPN Subject: Appt                                           Pt was dx with fetal demise (miscarriage) at [facility] today, and needs appt w [place] tomorrow or Monday at latest to discuss management options (reg sees Dr Glennon Mac but can see any MD).  Thx

## 2017-07-01 NOTE — Telephone Encounter (Signed)
Patient is schedule with [facility] 07/02/17

## 2017-07-02 ENCOUNTER — Ambulatory Visit (INDEPENDENT_AMBULATORY_CARE_PROVIDER_SITE_OTHER): Payer: 59 | Admitting: Obstetrics and Gynecology

## 2017-07-02 ENCOUNTER — Telehealth: Payer: Self-pay | Admitting: Obstetrics and Gynecology

## 2017-07-02 ENCOUNTER — Encounter: Payer: Self-pay | Admitting: Obstetrics and Gynecology

## 2017-07-02 VITALS — BP 104/62 | HR 76 | Wt 134.0 lb

## 2017-07-02 DIAGNOSIS — Z6790 Unspecified blood type, Rh positive: Secondary | ICD-10-CM | POA: Diagnosis not present

## 2017-07-02 DIAGNOSIS — O021 Missed abortion: Secondary | ICD-10-CM

## 2017-07-02 NOTE — Telephone Encounter (Signed)
Patient returned call, is having second thoughts, and would like to cancel the surgery and wait a couple of weeks.

## 2017-07-02 NOTE — Telephone Encounter (Signed)
[facility] is aware. Faxed cancellation to OR.

## 2017-07-02 NOTE — Telephone Encounter (Signed)
Lmtrc

## 2017-07-02 NOTE — Progress Notes (Signed)
Obstetrics & Gynecology Office Visit   Chief Complaint:  Chief Complaint  Patient presents with  . Follow-up    First trimester miscarriage    History of Present Illness: Morgan Sullivan is a 36 y.o. [phone] with positive NIPT for T:21 who presents for missed abortion.  She presented for follow up ultrasound and counseling to [facility] on 07/01/17 at which time a missed abortion was diagnosed with CRL of [redacted]w[redacted]d.  She presents today to further discuss management options and is leaning towards proceeding with D&C.   Review of Systems: Review of Systems  Constitutional: Negative for chills and fever.  HENT: Negative for congestion.   Respiratory: Negative for cough and shortness of breath.   Cardiovascular: Negative for chest pain and palpitations.  Gastrointestinal: Negative for abdominal pain, constipation, diarrhea, heartburn, nausea and vomiting.  Genitourinary: Negative for dysuria, frequency and urgency.  Neurological: Negative for dizziness and headaches.  Endo/Heme/Allergies: Negative for polydipsia.     Past Medical History:  Past Medical History:  Diagnosis Date  . Depression     Past Surgical History:  No past surgical history on file.  Gynecologic History: Patient's last menstrual period was 04/09/2017 (exact date).  Obstetric History: [id number]  Family History:  Family History  Problem Relation Age of Onset  . Skin cancer Mother 80  . Alzheimer's disease Maternal Grandmother   . Prostate cancer Maternal Grandfather   . Colon cancer Paternal Grandfather     Social History:  Social History   Social History  . Marital status: Married    Spouse name: N/A  . Number of children: N/A  . Years of education: N/A   Occupational History  . Not on file.   Social History Main Topics  . Smoking status: Former Research scientist (life sciences)  . Smokeless tobacco: Never Used  . Alcohol use 0.6 oz/week    1 Glasses of wine per week     Comment: not during pregnancy  . Drug use:  No  . Sexual activity: Yes    Partners: Male   Other Topics Concern  . Not on file   Social History Narrative  . No narrative on file    Allergies:  No Known Allergies  Medications: Prior to Admission medications   Medication Sig Start Date End Date Taking? Authorizing Provider  Prenatal Vit-Fe Fumarate-FA (MULTIVITAMIN-PRENATAL) 27-0.8 MG TABS tablet Take 1 tablet by mouth daily at 12 noon.   Yes [provider]  sertraline (ZOLOFT) 100 MG tablet Take 100 mg by mouth daily.   Yes [provider]    Physical Exam Vitals:  Vitals:   07/02/17 1008  BP: 104/62  Pulse: 76   Patient's last menstrual period was 04/09/2017 (exact date).  General: NAD HEENT: normocephalic, anicteric Pulmonary: CTAB, No increased work of breathing CV: RRR Abdomen: soft, non-tender, non-distended Extremities: no edema, erythema, or tenderness Neurologic: Grossly intact Psychiatric: mood appropriate, affect full  Female chaperone present for pelvic and breast  portions of the physical exam  Assessment: 36 y.o. [id number] with 9 week missed abortion  Plan: Problem List Items Addressed This Visit    None    Visit Diagnoses    Missed abortion    -  Primary   Rh(D) positive          Condolences were offered to the patient and her family.  I stressed that while emotionally difficult, that this did not occur because of an actions or inactions by the patient.  Somewhere between 10-20% of  identified first trimester pregnancies will unfortunately end in miscarriage.  Given this relatively high incidence rate, further diagnostic testing such as chromosome analysis is generally not clinically relevant nor recommended.  Although the chromosomal abnormalities have been implicated at rates as high as 70% in some studies, these are generally random and do not infer and increased risk of recurrence with subsequent pregnancies.  However, 3 or more consecutive first trimester losses are  relatively uncommon, and these patient generally do benefit from additional work up to determine a potential modifiable etiology.   We briefly discussed management options including expectant management, medical management, and surgical management as well as their relative success rates and complications. Approximately 80% of first trimester miscarriages will pass successfully but may require a time frame of up to 8 weeks ([place] 150 May 2015 "Early Pregnancy Loss").  Medical management using 863mcg of misoprostil administered every 3-hrs as needed for up to 3 doses speeds up the time frame to completion significantly, has literature supporting its use up to 63 days or [redacted]w[redacted]d gestation and results in a passage rate of 84-85% (ACOG Practice Bulletin 143 March 2014 "Medical Management of First-Trimester Abortion").  Dilation and curettage has the highest rate of uterine evacuation, but carries with is operative cost, surgical and anesthetic risk.  While these risk are relatively small they nevertheless include infection, bleeding, uterine perforation, formation of uterine synechia, and in rare cases death.   We discussed repeat ultrasound and or trending HCG levels if the patient wishes to pursue these prior to making her decision.  Clinically I am confident of the diagnosis, but I do not want any doubts in the patient's mind regarding the plan of management she chooses to adopt.  I will allow the patient and her family to discuss management options and she was advised to contact the office to arrange final disposition one she has made her decision or should she have any follow up questions for myself.    The patient is Rh positive and rhogam is therefore not indicated.  She is interested in proceeding with D&C at this point.  A total of 15 minutes were spent in face-to-face contact with the patient during this encounter with over half of that time devoted to counseling and coordination of  care.

## 2017-07-06 ENCOUNTER — Encounter: Payer: Self-pay | Admitting: Obstetrics and Gynecology

## 2017-07-06 ENCOUNTER — Other Ambulatory Visit: Payer: Self-pay | Admitting: Obstetrics and Gynecology

## 2017-07-06 MED ORDER — IBUPROFEN 600 MG PO TABS: 600.0000 mg | ORAL_TABLET | Freq: Four times a day (QID) | ORAL | 3 refills | Status: DC | PRN

## 2017-07-06 MED ORDER — OXYCODONE-ACETAMINOPHEN 5-325 MG PO TABS: 1.0000 | ORAL_TABLET | ORAL | 0 refills | Status: DC | PRN

## 2017-07-07 ENCOUNTER — Other Ambulatory Visit: Payer: Self-pay | Admitting: Obstetrics and Gynecology

## 2017-07-07 MED ORDER — MISOPROSTOL 200 MCG PO TABS: ORAL_TABLET | ORAL | 0 refills | Status: DC

## 2017-07-08 ENCOUNTER — Ambulatory Visit: Admit: 2017-07-08 | Payer: 59 | Admitting: Obstetrics and Gynecology

## 2017-07-08 ENCOUNTER — Encounter: Payer: Self-pay | Admitting: Obstetrics and Gynecology

## 2017-07-08 SURGERY — DILATION AND EVACUATION, UTERUS
Anesthesia: Choice

## 2017-07-09 ENCOUNTER — Encounter: Payer: 59 | Admitting: Obstetrics & Gynecology

## 2017-07-10 ENCOUNTER — Emergency Department
Admission: EM | Admit: 2017-07-10 | Discharge: 2017-07-10 | Disposition: A | Discharge status: Home / Self Care | Payer: 59 | Attending: Emergency Medicine | Admitting: Emergency Medicine

## 2017-07-10 ENCOUNTER — Encounter: Payer: Self-pay | Admitting: Emergency Medicine

## 2017-07-10 DIAGNOSIS — Z87891 Personal history of nicotine dependence: Secondary | ICD-10-CM | POA: Diagnosis not present

## 2017-07-10 DIAGNOSIS — O209 Hemorrhage in early pregnancy, unspecified: Secondary | ICD-10-CM | POA: Diagnosis present

## 2017-07-10 DIAGNOSIS — Z79899 Other long term (current) drug therapy: Secondary | ICD-10-CM | POA: Insufficient documentation

## 2017-07-10 DIAGNOSIS — E860 Dehydration: Secondary | ICD-10-CM | POA: Diagnosis not present

## 2017-07-10 DIAGNOSIS — Z3A00 Weeks of gestation of pregnancy not specified: Secondary | ICD-10-CM | POA: Insufficient documentation

## 2017-07-10 DIAGNOSIS — F329 Major depressive disorder, single episode, unspecified: Secondary | ICD-10-CM | POA: Diagnosis not present

## 2017-07-10 DIAGNOSIS — O039 Complete or unspecified spontaneous abortion without complication: Secondary | ICD-10-CM | POA: Diagnosis not present

## 2017-07-10 LAB — CBC WITH DIFFERENTIAL/PLATELET
BASOS PCT: 0 %
Basophils Absolute: 0 10*3/uL (ref 0–0.1)
EOS ABS: 0 10*3/uL (ref 0–0.7)
Eosinophils Relative: 1 %
HCT: 29.9 % — ABNORMAL LOW (ref 35.0–47.0)
Hemoglobin: 10.3 g/dL — ABNORMAL LOW (ref 12.0–16.0)
Lymphocytes Relative: 13 %
Lymphs Abs: 1.1 10*3/uL (ref 1.0–3.6)
MCH: 30.7 pg (ref 26.0–34.0)
MCHC: 34.6 g/dL (ref 32.0–36.0)
MCV: 88.8 fL (ref 80.0–100.0)
MONO ABS: 0.5 10*3/uL (ref 0.2–0.9)
MONOS PCT: 6 %
Neutro Abs: 6.9 10*3/uL — ABNORMAL HIGH (ref 1.4–6.5)
Neutrophils Relative %: 80 %
Platelets: 227 10*3/uL (ref 150–440)
RBC: 3.36 MIL/uL — ABNORMAL LOW (ref 3.80–5.20)
RDW: 13.3 % (ref 11.5–14.5)
WBC: 8.5 10*3/uL (ref 3.6–11.0)

## 2017-07-10 LAB — BASIC METABOLIC PANEL
Anion gap: 6 (ref 5–15)
BUN: 7 mg/dL (ref 6–20)
CALCIUM: 8.3 mg/dL — AB (ref 8.9–10.3)
CO2: 24 mmol/L (ref 22–32)
CREATININE: 0.68 mg/dL (ref 0.44–1.00)
Chloride: 104 mmol/L (ref 101–111)
GFR calc non Af Amer: >60 mL/min (ref >60)
GLUCOSE: 109 mg/dL — AB (ref 65–99)
Potassium: 3.7 mmol/L (ref 3.5–5.1)
Sodium: 134 mmol/L — ABNORMAL LOW (ref 135–145)

## 2017-07-10 MED ORDER — SODIUM CHLORIDE 0.9 % IV BOLUS (SEPSIS)
1000.0000 mL | Freq: Once | INTRAVENOUS | Status: AC
  Administered 2017-07-10: 1000 mL via INTRAVENOUS

## 2017-07-10 NOTE — ED Triage Notes (Signed)
Patient to ER for c/o vaginal bleeding with miscarriage. Patient sees Dr. Georgianne Fick at [facility], received Cytotec for miscarriage. Patient was 13 weeks, measuring 9 weeks 4 days. States she had very large gush of blood last night and had to sit on toilet for several hours d/t bleeding. Reports she did see fetus passed at that time. States bleeding has diminished since then, but feels very weak.

## 2017-07-10 NOTE — ED Provider Notes (Signed)
[facility] Emergency Department Provider Note ____________________________________________   I have reviewed the triage vital signs and the triage nursing note.  HISTORY  Chief Complaint Vaginal Bleeding   Historian Patient and significant other in the room  HPI Morgan Sullivan is a 36 y.o. female G4 P2 012 followed by [facility] OB/GYN presents to the ER for dizziness and lightheadedness and vaginal bleeding after Cytotec for treatment of missed abortion, took buccal dose yesterday.  She had also taken a Vicodin and was unsure whether or not her ongoing lightheadedness generalized fatigue may be related to medication, or the fact that she is continuing to have bleeding and soaking a pad every couple of hours.  She did report tissue and fetus passed yesterday.  No more abdominal pain, this is resolved.    Past Medical History:  Diagnosis Date  . Depression     Patient Active Problem List   Diagnosis Date Noted  . Abnormal genetic test during pregnancy 06/25/2017  . Nausea and vomiting during pregnancy 06/11/2017  . Supervision of high-risk pregnancy 06/02/2017  . Supervision of elderly multigravida in first trimester 06/02/2017  . [redacted] weeks gestation of pregnancy 06/02/2017  . Depression affecting pregnancy 09/12/2015  . Anxiety disorder affecting pregnancy, antepartum 01/12/2013    History reviewed. No pertinent surgical history.  Prior to Admission medications   Medication Sig Start Date End Date Taking? Authorizing Provider  ibuprofen (ADVIL,MOTRIN) 600 MG tablet Take 1 tablet (600 mg total) by mouth every 6 (six) hours as needed for mild pain, moderate pain or cramping. 07/06/17   Malachy Mood, MD  misoprostol (CYTOTEC) 200 MCG tablet Place 4 tablets in between your gums and cheeks (2 tablets on each side) as instructed OR insert 4 tablets vaginally, repeat every 6-hrs as needed 07/07/17   Malachy Mood, MD  oxyCODONE-acetaminophen  (PERCOCET/ROXICET) 5-325 MG tablet Take 1 tablet by mouth every 4 (four) hours as needed for severe pain. 07/06/17   Malachy Mood, MD  Prenatal Vit-Fe Fumarate-FA (MULTIVITAMIN-PRENATAL) 27-0.8 MG TABS tablet Take 1 tablet by mouth daily at 12 noon.    [provider]  sertraline (ZOLOFT) 100 MG tablet Take 100 mg by mouth daily.    [provider]    No Known Allergies  Family History  Problem Relation Age of Onset  . Skin cancer Mother 87  . Alzheimer's disease Maternal Grandmother   . Prostate cancer Maternal Grandfather   . Colon cancer Paternal Grandfather     Social History Social History  Substance Use Topics  . Smoking status: Former Research scientist (life sciences)  . Smokeless tobacco: Never Used  . Alcohol use 0.6 oz/week    1 Glasses of wine per week     Comment: not during pregnancy    Review of Systems  Constitutional: Negative for fever. Eyes: Negative for visual changes. ENT: Negative for sore throat. Cardiovascular: Negative for chest pain. Respiratory: Negative for shortness of breath. Gastrointestinal: Negative for abdominal pain, vomiting and diarrhea. Genitourinary: Negative for dysuria. Musculoskeletal: Negative for back pain. Skin: Negative for rash. Neurological: Negative for headache.  ____________________________________________   PHYSICAL EXAM:  VITAL SIGNS: ED Triage Vitals  Enc Vitals Group     BP 07/10/17 0748 106/69     Pulse Rate 07/10/17 0748 73     Resp 07/10/17 0748 19     Temp 07/10/17 0748 98.2 F (36.8 C)     Temp Source 07/10/17 0748 Oral     SpO2 07/10/17 0748 100 %  Weight 07/10/17 0740 134 lb (60.8 kg)     Height 07/10/17 0740 5\' 2"  (1.575 m)     Head Circumference --      Peak Flow --      Pain Score --      Pain Loc --      Pain Edu? --      Excl. in [place]? --      Constitutional: Alert and oriented. Well appearing and in no distress. HEENT   Head: Normocephalic and atraumatic.      Eyes: Conjunctivae are  normal. Pupils equal and round.       Ears:         Nose: No congestion/rhinnorhea.   Mouth/Throat: Mucous membranes are moist.   Neck: No stridor. Cardiovascular/Chest: Normal rate, regular rhythm.  No murmurs, rubs, or gallops. Respiratory: Normal respiratory effort without tachypnea nor retractions. Breath sounds are clear and equal bilaterally. No wheezes/rales/rhonchi. Gastrointestinal: Soft. No distention, no guarding, no rebound. Nontender.  Genitourinary/rectal:Deferred Musculoskeletal: Nontender with normal range of motion in all extremities. No joint effusions.  No lower extremity tenderness.  No edema. Neurologic:  Normal speech and language. No gross or focal neurologic deficits are appreciated. Skin:  Skin is warm, dry and intact. No rash noted. Psychiatric: Mood and affect are normal. Speech and behavior are normal. Patient exhibits appropriate insight and judgment.   ____________________________________________  LABS (pertinent positives/negatives) I, Lisa Roca, MD the attending physician have reviewed the labs noted below.  Labs Reviewed  BASIC METABOLIC PANEL - Abnormal; Notable for the following:       Result Value   Sodium 134 (*)    Glucose, Bld 109 (*)    Calcium 8.3 (*)    All other components within normal limits  CBC WITH DIFFERENTIAL/PLATELET - Abnormal; Notable for the following:    RBC 3.36 (*)    Hemoglobin 10.3 (*)    HCT 29.9 (*)    Neutro Abs 6.9 (*)    All other components within normal limits    ____________________________________________    EKG I, Lisa Roca, MD, the attending physician have personally viewed and interpreted all ECGs.  None ____________________________________________  RADIOLOGY All Xrays were viewed by me.  Imaging interpreted by Radiologist, and I, Lisa Roca, MD the attending physician have reviewed the radiologist interpretation noted  below.  None __________________________________________  PROCEDURES  Procedure(s) performed: None  Critical Care performed: None  ____________________________________________  No current facility-administered medications on file prior to encounter.    Current Outpatient Prescriptions on File Prior to Encounter  Medication Sig Dispense Refill  . ibuprofen (ADVIL,MOTRIN) 600 MG tablet Take 1 tablet (600 mg total) by mouth every 6 (six) hours as needed for mild pain, moderate pain or cramping. 60 tablet 3  . misoprostol (CYTOTEC) 200 MCG tablet Place 4 tablets in between your gums and cheeks (2 tablets on each side) as instructed OR insert 4 tablets vaginally, repeat every 6-hrs as needed 12 tablet 0  . oxyCODONE-acetaminophen (PERCOCET/ROXICET) 5-325 MG tablet Take 1 tablet by mouth every 4 (four) hours as needed for severe pain. 20 tablet 0  . Prenatal Vit-Fe Fumarate-FA (MULTIVITAMIN-PRENATAL) 27-0.8 MG TABS tablet Take 1 tablet by mouth daily at 12 noon.    . sertraline (ZOLOFT) 100 MG tablet Take 100 mg by mouth daily.      ____________________________________________  ED COURSE / ASSESSMENT AND PLAN  Pertinent labs & imaging results that were available during my care of the patient were reviewed by me and  considered in my medical decision making (see chart for details).   Stable vitals on arrival.  We will give bag of fluids, patient looks a little dehydrated with dry mouth and just exhausted after not sleeping much overnight up and down to the bathroom due to the bleeding.  Patient feels much better symptomatically after fluids.  We discussed the drop in her hemoglobin from last noted at 13-10 today.  She has not had any significant ongoing vaginal bleeding since she has been here and again she is not having abdominal pain.  We did choose to defer pelvic exam at this point.  She had just been up to the bathroom and noted very little vaginal bleeding at all.  She feels  comfortable discharging home now for follow-up as scheduled with her OB/GYN.  Discussed iron supplementation.   CONSULTATIONS:   None   Patient / Family / Caregiver informed of clinical course, medical decision-making process, and agree with plan.   I discussed return precautions, follow-up instructions, and discharge instructions with patient and/or family.  Discharge Instructions : You are evaluated for bleeding and dizziness during miscarriage, and your exam and evaluation are overall reassuring in the emergency room today.  Return to the emergency department immediately for any new or worsening symptoms including dizziness or passing out, new or worsening or uncontrolled abdominal pain, fever, heavy or uncontrolled vaginal bleeding including passing more blood then changing pad per hour, or any other symptoms concerning to you.  ___________________________________________   FINAL CLINICAL IMPRESSION(S) / ED DIAGNOSES   Final diagnoses:  Miscarriage              Note: This dictation was prepared with Dragon dictation. Any transcriptional errors that result from this process are unintentional    Lisa Roca, MD 07/10/17 1040

## 2017-07-10 NOTE — Discharge Instructions (Signed)
You are evaluated for bleeding and dizziness during miscarriage, and your exam and evaluation are overall reassuring in the emergency room today.  Return to the emergency department immediately for any new or worsening symptoms including dizziness or passing out, new or worsening or uncontrolled abdominal pain, fever, heavy or uncontrolled vaginal bleeding including passing more blood then changing pad per hour, or any other symptoms concerning to you.

## 2017-07-12 ENCOUNTER — Other Ambulatory Visit: Payer: Self-pay | Admitting: Obstetrics and Gynecology

## 2017-07-12 ENCOUNTER — Emergency Department
Admission: EM | Admit: 2017-07-12 | Discharge: 2017-07-13 | Disposition: A | Discharge status: Home / Self Care | Payer: 59 | Attending: Emergency Medicine | Admitting: Emergency Medicine

## 2017-07-12 ENCOUNTER — Telehealth: Payer: Self-pay

## 2017-07-12 DIAGNOSIS — O209 Hemorrhage in early pregnancy, unspecified: Secondary | ICD-10-CM | POA: Diagnosis present

## 2017-07-12 DIAGNOSIS — Z87891 Personal history of nicotine dependence: Secondary | ICD-10-CM | POA: Insufficient documentation

## 2017-07-12 DIAGNOSIS — O034 Incomplete spontaneous abortion without complication: Secondary | ICD-10-CM | POA: Diagnosis not present

## 2017-07-12 DIAGNOSIS — O039 Complete or unspecified spontaneous abortion without complication: Secondary | ICD-10-CM

## 2017-07-12 DIAGNOSIS — N939 Abnormal uterine and vaginal bleeding, unspecified: Secondary | ICD-10-CM

## 2017-07-12 DIAGNOSIS — D649 Anemia, unspecified: Secondary | ICD-10-CM | POA: Insufficient documentation

## 2017-07-12 DIAGNOSIS — Z79899 Other long term (current) drug therapy: Secondary | ICD-10-CM | POA: Insufficient documentation

## 2017-07-12 LAB — CBC
HCT: 25.3 % — ABNORMAL LOW (ref 35.0–47.0)
Hemoglobin: 8.6 g/dL — ABNORMAL LOW (ref 12.0–16.0)
MCH: 30.2 pg (ref 26.0–34.0)
MCHC: 33.9 g/dL (ref 32.0–36.0)
MCV: 88.9 fL (ref 80.0–100.0)
PLATELETS: 245 10*3/uL (ref 150–440)
RBC: 2.84 MIL/uL — ABNORMAL LOW (ref 3.80–5.20)
RDW: 13.5 % (ref 11.5–14.5)
WBC: 13 10*3/uL — AB (ref 3.6–11.0)

## 2017-07-12 NOTE — Telephone Encounter (Signed)
Needs ultrasound sometime in the next week and follow up with myself

## 2017-07-12 NOTE — ED Notes (Signed)
Pt states that she took cytotec on Friday and started the process of miscarriage.  Pt states bleeding mostly stopped after 24 hours.  Pt states that today she started to have "hard" contractions and that she felt that she had part of the placenta trying to come out.  Pt states now contractions have stopped, but she can still feel area coming out of her vagina.  Pt states she is no longer bleed.  Pt is patient of [facility] and called them and was told to come to ER.

## 2017-07-12 NOTE — Telephone Encounter (Signed)
Pt called after hour nurse c/o had a miscarriage last night after taking meds to induce miscarriage d/t non-viable fetus.  She is still having a lot of bleeding and dizziness.  Has gotten up every couple of hours and pads are soaked.  Was 9w preg. After hour nurse adv to go to ED.  Pt went to [facility] ED where she was given fluids and evaluated and to f/u c [place] as scheduled.

## 2017-07-12 NOTE — Discharge Instructions (Signed)
Please seek medical attention for any high fevers, chest pain, shortness of breath, change in behavior, persistent vomiting, bloody stool or any other new or concerning symptoms.  

## 2017-07-12 NOTE — ED Triage Notes (Signed)
Pt reports she was seen at [facility] on Thursday for 12 week check up, pt given cytotek due to passing of fetus in womb. Pt took medication on Friday, had contractions, passed clots and product of conception on Saturday. Today pt started to bleed and noticed what pt describes as the placenta coming out of vagina. Pt unable to pass and pulled part of object out. Pt to ED tonight due to possible need of DNC.

## 2017-07-13 NOTE — Telephone Encounter (Signed)
Pt is schedule Tuesday 07/13/17. Pt reports she was seen in ER last night and would like Dr. Georgianne Fick to review the notes.

## 2017-07-13 NOTE — ED Notes (Signed)
Pt discharged to home.  Family member driving.  Discharge instructions reviewed.  Verbalized understanding.  No questions or concerns at this time.  Teach back verified.  Pt in NAD.  No items left in ED.   

## 2017-07-13 NOTE — ED Notes (Signed)
Pt refused to stay for d/c papers to be given and refused d/c vitals.  This RN notified her that paperwork and d/c would be done shortly, but pt declined to stay.

## 2017-07-13 NOTE — ED Provider Notes (Signed)
[facility] Emergency Department Provider Note   ____________________________________________   I have reviewed the triage vital signs and the nursing notes.   HISTORY  Chief Complaint Vaginal bleeding  History limited by: Not Limited   HPI Morgan Sullivan is a 36 y.o. female who presents to the emergency department today because of concerns for vaginal bleeding and tissue passage   LOCATION:vaginal DURATION:started today TIMING: constant however ended about 45 minutes prior to my evaluation SEVERITY: used multiple pads per hour QUALITY: bright red blood CONTEXT: patient has recently undergone a miscarriage. Had been given Cytotec and felt like she passed the fetus couple of days ago. Today the patient noticed some increasing cramping in the lower abdomen as well as vaginal bleeding. Prior to my evaluation of the cramping and bleeding had resolved. MODIFYING FACTORS: none identified ASSOCIATED SYMPTOMS: lower abdominal cramping. patient denies any shortness of breath.  Per medical record review patient has a history of recent miscarriage and use of Cytotec  Past Medical History:  Diagnosis Date  . Depression     Patient Active Problem List   Diagnosis Date Noted  . Abnormal genetic test during pregnancy 06/25/2017  . Nausea and vomiting during pregnancy 06/11/2017  . Supervision of high-risk pregnancy 06/02/2017  . Supervision of elderly multigravida in first trimester 06/02/2017  . [redacted] weeks gestation of pregnancy 06/02/2017  . Depression affecting pregnancy 09/12/2015  . Anxiety disorder affecting pregnancy, antepartum 01/12/2013    No past surgical history on file.  Prior to Admission medications   Medication Sig Start Date End Date Taking? Authorizing Provider  ibuprofen (ADVIL,MOTRIN) 600 MG tablet Take 1 tablet (600 mg total) by mouth every 6 (six) hours as needed for mild pain, moderate pain or cramping. 07/06/17   Malachy Mood, MD   misoprostol (CYTOTEC) 200 MCG tablet Place 4 tablets in between your gums and cheeks (2 tablets on each side) as instructed OR insert 4 tablets vaginally, repeat every 6-hrs as needed 07/07/17   Malachy Mood, MD  oxyCODONE-acetaminophen (PERCOCET/ROXICET) 5-325 MG tablet Take 1 tablet by mouth every 4 (four) hours as needed for severe pain. 07/06/17   Malachy Mood, MD  Prenatal Vit-Fe Fumarate-FA (MULTIVITAMIN-PRENATAL) 27-0.8 MG TABS tablet Take 1 tablet by mouth daily at 12 noon.    [provider]  sertraline (ZOLOFT) 100 MG tablet Take 100 mg by mouth daily.    [provider]    Allergies Patient has no known allergies.  Family History  Problem Relation Age of Onset  . Skin cancer Mother 73  . Alzheimer's disease Maternal Grandmother   . Prostate cancer Maternal Grandfather   . Colon cancer Paternal Grandfather     Social History Social History  Substance Use Topics  . Smoking status: Former Research scientist (life sciences)  . Smokeless tobacco: Never Used  . Alcohol use 0.6 oz/week    1 Glasses of wine per week     Comment: not during pregnancy    Review of Systems Constitutional: No fever/chills Eyes: No visual changes. ENT: No sore throat. Cardiovascular: Denies chest pain. Respiratory: Denies shortness of breath. Gastrointestinal: No abdominal pain.  No nausea, no vomiting.  No diarrhea.   Genitourinary: positive for vaginal bleeding Musculoskeletal: Negative for back pain. Skin: Negative for rash. Neurological: Negative for headaches, focal weakness or numbness.  ____________________________________________   PHYSICAL EXAM:  VITAL SIGNS: ED Triage Vitals [07/12/17 2045]  Enc Vitals Group     BP 139/63     Pulse Rate 78  Resp 16     Temp 98.2 F (36.8 C)     Temp Source Oral     SpO2 100 %   Constitutional: Alert and oriented. Well appearing and in no distress. Eyes: Conjunctivae are normal.  ENT   Head: Normocephalic and atraumatic.    Nose: No congestion/rhinnorhea.   Mouth/Throat: Mucous membranes are moist.   Neck: No stridor. Hematological/Lymphatic/Immunilogical: No cervical lymphadenopathy. Cardiovascular: Normal rate, regular rhythm.  No murmurs, rubs, or gallops.  Respiratory: Normal respiratory effort without tachypnea nor retractions. Breath sounds are clear and equal bilaterally. No wheezes/rales/rhonchi. Gastrointestinal: Soft and non tender. No rebound. No guarding.  Genitourinary: tissue noted in the distal vaginal vault. Musculoskeletal: Normal range of motion in all extremities. No lower extremity edema. Neurologic:  Normal speech and language. No gross focal neurologic deficits are appreciated.  Skin:  Skin is warm, dry and intact. No rash noted. Psychiatric: Mood and affect are normal. Speech and behavior are normal. Patient exhibits appropriate insight and judgment.  ____________________________________________    LABS (pertinent positives/negatives)  hgb 8.6  ____________________________________________   EKG  None  ____________________________________________    RADIOLOGY  None   ____________________________________________   PROCEDURES  Procedures  ____________________________________________   INITIAL IMPRESSION / ASSESSMENT AND PLAN / ED COURSE  Pertinent labs & imaging results that were available during my care of the patient were reviewed by me and considered in my medical decision making (see chart for details).  patient presented to the emergency department today because of concerns for vaginal bleeding. Exam did show tissue in the distal vaginal vault. After the patient use the bathroom the day she did migrate to the proximal vaginal vault. The use of ring forceps I was able to remove the tissue. The patient felt immediate relief. She had a small amount of subsequent bleeding. However no further abdominal cramping. Hemoglobin was checked given bleeding and this is  now down to 8.6. I did discuss this finding with the patient. I did offer admission. She however felt comfortable going home given the fact the bleeding has resolved. She states that she could return quickly and that they live close to the hospital of bleeding to return. Did discuss with patient importance of following up with her OB/GYN doctor. ____________________________________________   FINAL CLINICAL IMPRESSION(S) / ED DIAGNOSES  Final diagnoses:  Vaginal bleeding  Miscarriage  Anemia, unspecified type     Note: This dictation was prepared with Dragon dictation. Any transcriptional errors that result from this process are unintentional     Nance Pear, MD 07/13/17 [id number]

## 2017-07-14 ENCOUNTER — Telehealth: Payer: Self-pay

## 2017-07-14 NOTE — Telephone Encounter (Signed)
Pt called the after hour nurse 07/12/17 at 7:41 c/o vaginal bleeding, took cytotec Fri to help pass a miscarriage and did pass some tissue, Now have bleeding, ctx-like cramps and loods like the placenta is stuck.  Pt was adv to go to the ED which she did - see note.  Pt has f/u appt 10/30th.

## 2017-07-20 ENCOUNTER — Encounter: Payer: Self-pay | Admitting: Obstetrics and Gynecology

## 2017-07-20 ENCOUNTER — Ambulatory Visit (INDEPENDENT_AMBULATORY_CARE_PROVIDER_SITE_OTHER): Payer: 59 | Admitting: Obstetrics and Gynecology

## 2017-07-20 ENCOUNTER — Ambulatory Visit (INDEPENDENT_AMBULATORY_CARE_PROVIDER_SITE_OTHER): Payer: 59

## 2017-07-20 VITALS — BP 110/64 | HR 64 | Ht 62.0 in | Wt 131.0 lb

## 2017-07-20 DIAGNOSIS — O039 Complete or unspecified spontaneous abortion without complication: Secondary | ICD-10-CM

## 2017-07-20 DIAGNOSIS — D62 Acute posthemorrhagic anemia: Secondary | ICD-10-CM | POA: Diagnosis not present

## 2017-07-20 DIAGNOSIS — Z634 Disappearance and death of family member: Secondary | ICD-10-CM | POA: Diagnosis not present

## 2017-07-20 NOTE — Progress Notes (Signed)
Gynecology Ultrasound Follow Up  Chief Complaint:  Chief Complaint  Patient presents with  . Follow-up    U/S follow up SAB     History of Present Illness: Patient is a 36 y.o. female who presents today for ultrasound evaluation of medically managed first trimester abortion.  Ultrasound demonstrates the following findgins Adnexa: normal Uterus: Non-enlarged, with herterogenous endometrial stripe measuring 1.3cm Additional: no free fluid  She presented to the ED with heavy vaginal bleeding on 07/10/17 after her cytotec administration.  Bleeding slowed down and H&H was noted to be 10.3 and 29.9.  She subsequently noted increased bleeding and cramping again on 07/12/17 and had retained products removed from the cervical os in the ED (does not appear to have been sent for pathology).  Since that time her bleeding has been light, no cramping.  Second ED visit H&H drop to 8.6 & 25.3 she is currently taking po iron supplements.  She is grieving appropriately, has noted some mood lability but she feels appropriate considering the situation.    Review of Systems: Review of Systems  Constitutional: Positive for malaise/fatigue. Negative for chills and fever.  Gastrointestinal: Negative for abdominal pain.  Neurological: Negative for dizziness.    Past Medical History:  Past Medical History:  Diagnosis Date  . Depression     Past Surgical History:  No past surgical history on file.  Gynecologic History:  Patient's last menstrual period was 04/09/2017 (exact date).  Family History:  Family History  Problem Relation Age of Onset  . Skin cancer Mother 3  . Alzheimer's disease Maternal Grandmother   . Prostate cancer Maternal Grandfather   . Colon cancer Paternal Grandfather     Social History:  Social History   Social History  . Marital status: Married    Spouse name: N/A  . Number of children: N/A  . Years of education: N/A   Occupational History  . Not on file.    Social History Main Topics  . Smoking status: Former Research scientist (life sciences)  . Smokeless tobacco: Never Used  . Alcohol use 0.6 oz/week    1 Glasses of wine per week     Comment: not during pregnancy  . Drug use: No  . Sexual activity: Yes    Partners: Male   Other Topics Concern  . Not on file   Social History Narrative  . No narrative on file    Allergies:  No Known Allergies  Medications: Prior to Admission medications   Medication Sig Start Date End Date Taking? Authorizing Provider  sertraline (ZOLOFT) 100 MG tablet Take by mouth. 11/10/16  Yes [provider]  fluticasone (FLONASE) 50 MCG/ACT nasal spray Place into the nose.    [provider]  ibuprofen (ADVIL,MOTRIN) 600 MG tablet Take 1 tablet (600 mg total) by mouth every 6 (six) hours as needed for mild pain, moderate pain or cramping. 07/06/17   Malachy Mood, MD  misoprostol (CYTOTEC) 200 MCG tablet Place 4 tablets in between your gums and cheeks (2 tablets on each side) as instructed OR insert 4 tablets vaginally, repeat every 6-hrs as needed 07/07/17   Malachy Mood, MD  oxyCODONE-acetaminophen (PERCOCET/ROXICET) 5-325 MG tablet Take 1 tablet by mouth every 4 (four) hours as needed for severe pain. 07/06/17   Malachy Mood, MD  Prenatal Vit-Fe Fumarate-FA (MULTIVITAMIN-PRENATAL) 27-0.8 MG TABS tablet Take 1 tablet by mouth daily at 12 noon.    [provider]  sertraline (ZOLOFT) 100 MG tablet Take 100 mg by mouth  daily.    [provider]    Physical Exam Vitals: Blood pressure 110/64, pulse 64, height 5\' 2"  (1.575 m), weight 131 lb (59.4 kg), last menstrual period 04/09/2017.  General: NAD HEENT: normocephalic, anicteric Pulmonary: No increased work of breathing Extremities: no edema, erythema, or tenderness Genitourinary  External: normal external female genitalia  Vaginal: normal vaginal mucosa, scan old blood noted on vaginal walls           Cervix: closed, no POC noted  at os, no active bleeding noted  Uterus: non-enlarged, anteverted, non-tender, normal contour  Adnexa: no adnexal masses or tenderness Neurologic: Grossly intact, normal gait Psychiatric: mood appropriate, affect full   Assessment: 36 y.o. [id number] with completed abortion  Plan: Problem List Items Addressed This Visit    None    Visit Diagnoses    Acute blood loss anemia    -  Primary   Relevant Orders   CBC   Complete abortion       Relevant Orders   CBC   Bereavement          1) Medically managed 1st trimester abortion at 9 weeks by CR[facility] - patient sounds to have had an incomplete abortion with placental tissue removed in the ED.  Striped today well below 3cm at 1.3cm, heterogenous.  This may represent thickened endometrium, blood products or POC.  There is no significant blood flow noted in this tissue.  Given symptoms improved and stripe is <3cm favor completed abortion. - bleeding precautions - should bleeding increase or cramping low threshold for D&C  2) Acute blood loss anemia - continue po ferrous sulfate - repeat CBC  3) Bereavement - we discussed normal bereavement process and time frame.  Should she feel symptoms are worsening as opposed to improving we did discuss potential of starting SSRI.  4)  Return in about 5 weeks (around 08/24/2017) for F/U.          Stripe 1.3cm

## 2017-07-21 ENCOUNTER — Encounter: Payer: Self-pay | Admitting: Obstetrics and Gynecology

## 2017-07-21 DIAGNOSIS — Z23 Encounter for immunization: Secondary | ICD-10-CM | POA: Diagnosis not present

## 2017-07-21 LAB — CBC
HEMATOCRIT: 30.6 % — AB (ref 34.0–46.6)
HEMOGLOBIN: 10.2 g/dL — AB (ref 11.1–15.9)
MCH: 29.9 pg (ref 26.6–33.0)
MCHC: 33.3 g/dL (ref 31.5–35.7)
MCV: 90 fL (ref 79–97)
Platelets: 424 10*3/uL — ABNORMAL HIGH (ref 150–379)
RBC: 3.41 x10E6/uL — AB (ref 3.77–5.28)
RDW: 15 % (ref 12.3–15.4)
WBC: 6 10*3/uL (ref 3.4–10.8)

## 2017-08-24 ENCOUNTER — Encounter: Payer: Self-pay | Admitting: Obstetrics and Gynecology

## 2017-08-24 ENCOUNTER — Ambulatory Visit (INDEPENDENT_AMBULATORY_CARE_PROVIDER_SITE_OTHER): Payer: 59 | Admitting: Obstetrics and Gynecology

## 2017-08-24 ENCOUNTER — Other Ambulatory Visit: Payer: Self-pay

## 2017-08-24 VITALS — BP 118/72 | HR 68 | Ht 62.0 in | Wt 136.0 lb

## 2017-08-24 DIAGNOSIS — Z3169 Encounter for other general counseling and advice on procreation: Secondary | ICD-10-CM

## 2017-08-24 DIAGNOSIS — O039 Complete or unspecified spontaneous abortion without complication: Secondary | ICD-10-CM | POA: Diagnosis not present

## 2017-08-24 NOTE — Progress Notes (Signed)
Obstetrics & Gynecology Office Visit   Chief Complaint:  Chief Complaint  Patient presents with  . Follow-up    SAB stopped bleeding ~1 wk ago    History of Present Illness: 36 year old [id number] presenting for 6 week follow up after medical managed missed abortion at [redacted] weeks gestation, NIPT positive for trisomy 32.  She is doing well currently on Zoloft 100mg , she stopped bleeding 1 week ago.  The last few weeks have been off and on bleeding, not constant but she has not had a normal menstrual cycle since her miscarriage.  She is uncertain but leaning towards attempting for another pregnancy.  We discussed the likely etiology was secondary to T21.  This risk is age dependent, but her risk of recurrence should not be higher than any other patient her age.  We discussed starting provera if no spontaneous menses by mid-January.  She conceived on cycle I clomid 50mg .  We discussed letrozole as well.  While there is evidence of higher pregnancy rate with letrozole in patient with PCOS, no data on patient with other ovulatory disfunction.  However, we did talk about letrozole have less thinning effect on the lining and that this may be more of an issue in petite patient's like herself.   Review of Systems: Review of Systems  Constitutional: Negative for chills and fever.  Gastrointestinal: Negative for abdominal pain.  Neurological: Negative for dizziness and headaches.  Psychiatric/Behavioral: Negative for depression.     Past Medical History:  Past Medical History:  Diagnosis Date  . Depression     Past Surgical History:  No past surgical history on file.  Gynecologic History: No LMP recorded (lmp unknown).  Obstetric History: [id number]  Family History:  Family History  Problem Relation Age of Onset  . Skin cancer Mother 16  . Alzheimer's disease Maternal Grandmother   . Prostate cancer Maternal Grandfather   . Colon cancer Paternal Grandfather     Social History:  Social  History   Socioeconomic History  . Marital status: Married    Spouse name: Not on file  . Number of children: Not on file  . Years of education: Not on file  . Highest education level: Not on file  Social Needs  . Financial resource strain: Not on file  . Food insecurity - worry: Not on file  . Food insecurity - inability: Not on file  . Transportation needs - medical: Not on file  . Transportation needs - non-medical: Not on file  Occupational History  . Not on file  Tobacco Use  . Smoking status: Former Research scientist (life sciences)  . Smokeless tobacco: Never Used  Substance and Sexual Activity  . Alcohol use: Yes    Alcohol/week: 0.6 oz    Types: 1 Glasses of wine per week    Comment: not during pregnancy  . Drug use: No  . Sexual activity: Yes    Partners: Male  Other Topics Concern  . Not on file  Social History Narrative  . Not on file    Allergies:  Allergies  Allergen Reactions  . Oxycodone-Acetaminophen Nausea And Vomiting    Medications: Prior to Admission medications   Medication Sig Start Date End Date Taking? Authorizing Provider  sertraline (ZOLOFT) 100 MG tablet Take 100 mg by mouth daily.   Yes [provider]  Prenatal Vit-Fe Fumarate-FA (MULTIVITAMIN-PRENATAL) 27-0.8 MG TABS tablet Take 1 tablet by mouth daily at 12 noon.    [provider]  Physical Exam Vitals:  Vitals:   08/24/17 0931  BP: 118/72  Pulse: 68   No LMP recorded (lmp unknown).  General: NAD HEENT: normocephalic, anicteric Pulmonary: No increased work of breathing Neurologic: Grossly intact Psychiatric: mood appropriate, affect full  Female chaperone present for pelvic and breast  portions of the physical exam  Assessment: 36 y.o. [id number] follow up completed abortion and preconception counseling  Plan: Problem List Items Addressed This Visit    None    Visit Diagnoses    Complete abortion    -  Primary   Encounter for preconception consultation         - Start  provera if no menses by January - Discussed letrozle vs clomid next cycle - start PNV - Discussed no recurrence risk for T21, would recommend NIPT testing given age - Definition of recurrent miscarriage, defined as 3 or more successive first trimester losses discussed in detail.   The patient has had 2 first trimester miscarriage but also two healthy term deliveries. The most commonly identifiable etiology is antiphospholipid antibody syndrome (APS).  While APS is the only thrombophilia with a clearly proven association for first trimester miscarriage, other hypercoagulable disorders while showing a weak or inconsistent relationship to first trimester miscarriage are often times still tested for,  These include Factor V Leiden, MTHFR (homozygous), prothrombin gene mutation, Protein C and S deficiency.  Uterine structural lesions, endocrine factors (thyoid disease, diabetes, and PCOS), and disorders in parental karyotype has also been implicated.  Use of daily Asprin has been evaluated in the setting of recurrent pregnancy loss and shows no clear benefit in patient without antiphospholipid antibody syndrome.  Approximately 50% of couple with recurrent miscarriage will not have a readily identifiable etiology.  In the case of her last miscarriage T21 testing was positive and represents the most likely etiology.  Several studies have examined and established a role for emotional support and stress reduction in improving pregnancy outcomes for patient with recurrent miscarriage. The tender love and care model utilizes weekly to twice weekly follow up, more frequent ultrasound, and has consistently demonstrated improved live birth rates in several studies.  The risk of miscarriage following documentation of fetal heart tones drops significantly to approximately 3%. - A total of 15 minutes were spent in face-to-face contact with the patient during this encounter with over half of that time devoted to counseling and  coordination of care.

## 2017-08-25 MED ORDER — SERTRALINE HCL 100 MG PO TABS: 100.0000 mg | ORAL_TABLET | Freq: Every day | ORAL | 6 refills | Status: DC

## 2017-09-10 ENCOUNTER — Encounter: Payer: Self-pay | Admitting: Obstetrics and Gynecology

## 2017-10-13 ENCOUNTER — Encounter: Payer: Self-pay | Admitting: Obstetrics and Gynecology

## 2017-11-03 DIAGNOSIS — Z3200 Encounter for pregnancy test, result unknown: Secondary | ICD-10-CM | POA: Diagnosis not present

## 2017-11-03 DIAGNOSIS — J069 Acute upper respiratory infection, unspecified: Secondary | ICD-10-CM | POA: Diagnosis not present

## 2017-11-12 ENCOUNTER — Other Ambulatory Visit: Payer: Self-pay | Admitting: Obstetrics and Gynecology

## 2017-11-12 ENCOUNTER — Telehealth: Payer: Self-pay

## 2017-11-12 DIAGNOSIS — N970 Female infertility associated with anovulation: Secondary | ICD-10-CM

## 2017-11-12 MED ORDER — LETROZOLE 2.5 MG PO TABS: 2.5000 mg | ORAL_TABLET | Freq: Every day | ORAL | 0 refills | Status: AC

## 2017-11-12 NOTE — Telephone Encounter (Signed)
Pt had discussed w/AMS trying letrosol for fertility on her next cycle. She has just started spotting today. She usually has a day or two spotting then have a heavier flow for another 2 days. Pt uncertain what day to count as day 1. Cb#[phone]

## 2017-11-12 NOTE — Telephone Encounter (Signed)
Spoke w/pt. Notified of AMS rx sent message & need for Lab 12/02/17. Lab apt scheduled for 9:40am

## 2017-11-12 NOTE — Progress Notes (Signed)
LMP 11/12/17 Letrzole cycle I 2.5mg  day 21 progesterone 12/02/17

## 2017-11-12 NOTE — Telephone Encounter (Signed)
Rx is in she should start this Sunday 11/14/17, come in for lab work 12/02/17

## 2017-12-02 ENCOUNTER — Other Ambulatory Visit: Payer: 59

## 2017-12-02 DIAGNOSIS — N970 Female infertility associated with anovulation: Secondary | ICD-10-CM

## 2017-12-03 ENCOUNTER — Encounter: Payer: Self-pay | Admitting: Obstetrics and Gynecology

## 2017-12-03 LAB — PROGESTERONE: Progesterone: 1.4 ng/mL

## 2017-12-14 ENCOUNTER — Other Ambulatory Visit: Payer: Self-pay | Admitting: Obstetrics and Gynecology

## 2017-12-14 MED ORDER — MEDROXYPROGESTERONE ACETATE 10 MG PO TABS: 10.0000 mg | ORAL_TABLET | Freq: Every day | ORAL | 0 refills | Status: DC

## 2017-12-18 ENCOUNTER — Other Ambulatory Visit: Payer: Self-pay | Admitting: Advanced Practice Midwife

## 2017-12-18 DIAGNOSIS — N979 Female infertility, unspecified: Secondary | ICD-10-CM

## 2017-12-18 MED ORDER — CLOMIPHENE CITRATE 50 MG PO TABS: 50.0000 mg | ORAL_TABLET | Freq: Every day | ORAL | 0 refills | Status: DC

## 2017-12-18 NOTE — Progress Notes (Signed)
Rx Clomid sent to patient pharmacy per patient request.

## 2017-12-20 ENCOUNTER — Other Ambulatory Visit: Payer: Self-pay | Admitting: Obstetrics and Gynecology

## 2017-12-20 DIAGNOSIS — N979 Female infertility, unspecified: Secondary | ICD-10-CM

## 2017-12-20 MED ORDER — CLOMIPHENE CITRATE 50 MG PO TABS: 50.0000 mg | ORAL_TABLET | Freq: Every day | ORAL | 0 refills | Status: AC

## 2017-12-20 NOTE — Progress Notes (Signed)
Cycle I clomid 50mg 

## 2017-12-28 DIAGNOSIS — N979 Female infertility, unspecified: Secondary | ICD-10-CM | POA: Diagnosis not present

## 2018-01-24 DIAGNOSIS — Z319 Encounter for procreative management, unspecified: Secondary | ICD-10-CM | POA: Diagnosis not present

## 2018-01-24 DIAGNOSIS — Z3141 Encounter for fertility testing: Secondary | ICD-10-CM | POA: Diagnosis not present

## 2018-01-25 DIAGNOSIS — N8500 Endometrial hyperplasia, unspecified: Secondary | ICD-10-CM | POA: Diagnosis not present

## 2018-01-27 ENCOUNTER — Telehealth: Payer: Self-pay

## 2018-01-27 NOTE — Telephone Encounter (Signed)
AMS, I printed the report from 02/2017. I think the fertility clinic would probably recommend he do another one, however, she was recent pregnant so we know she can get pregnant, just had miscarriage. Please call patient and discuss. Thanks

## 2018-01-27 NOTE — Telephone Encounter (Signed)
Pt calling to see if she can get a record of the semen analysis done on her husband sense they are seeing a fertility specialist now.

## 2018-01-28 NOTE — Telephone Encounter (Signed)
I spoke to patient and she would like for [place] to fax the report to [facility] FAX# [phone]

## 2018-01-28 NOTE — Telephone Encounter (Signed)
If we have it we can send it to [facility] because there is a medical release in her chart it might be in [place]

## 2018-01-28 NOTE — Telephone Encounter (Signed)
LVM for patient to call me back regarding if she wants to pick up or wants [place] to fax report. KJ CMA

## 2018-01-31 DIAGNOSIS — D1800 Hemangioma unspecified site: Secondary | ICD-10-CM | POA: Diagnosis not present

## 2018-01-31 DIAGNOSIS — Z1283 Encounter for screening for malignant neoplasm of skin: Secondary | ICD-10-CM | POA: Diagnosis not present

## 2018-01-31 DIAGNOSIS — D2230 Melanocytic nevi of unspecified part of face: Secondary | ICD-10-CM | POA: Diagnosis not present

## 2018-02-28 DIAGNOSIS — Z3141 Encounter for fertility testing: Secondary | ICD-10-CM | POA: Diagnosis not present

## 2018-03-31 DIAGNOSIS — M25512 Pain in left shoulder: Secondary | ICD-10-CM | POA: Diagnosis not present

## 2018-03-31 DIAGNOSIS — M542 Cervicalgia: Secondary | ICD-10-CM | POA: Diagnosis not present

## 2018-03-31 DIAGNOSIS — M9901 Segmental and somatic dysfunction of cervical region: Secondary | ICD-10-CM | POA: Diagnosis not present

## 2018-04-05 ENCOUNTER — Other Ambulatory Visit: Payer: Self-pay | Admitting: Obstetrics and Gynecology

## 2018-04-06 ENCOUNTER — Other Ambulatory Visit: Payer: Self-pay | Admitting: Obstetrics and Gynecology

## 2018-04-06 MED ORDER — SERTRALINE HCL 100 MG PO TABS: 100.0000 mg | ORAL_TABLET | Freq: Every day | ORAL | 11 refills | Status: DC

## 2018-04-06 NOTE — Telephone Encounter (Signed)
Pt was notified that her Sertraline was denied. Pt states she has had several visits since last year. She had a miscarriage in October. She has contacted her primary, but inquiring if it can be refilled as she is leaving for the [place] freaking out. VQ#[phone]

## 2018-04-11 DIAGNOSIS — Z3201 Encounter for pregnancy test, result positive: Secondary | ICD-10-CM | POA: Diagnosis not present

## 2018-04-20 DIAGNOSIS — O4691 Antepartum hemorrhage, unspecified, first trimester: Secondary | ICD-10-CM | POA: Diagnosis not present

## 2018-04-20 IMAGING — RF DG HYSTEROGRAM
1 series · 2 of 2 positions shown · IV contrast (omnipaque)
Comparison: None.

CLINICAL DATA: Infertility.

EXAM:
HYSTEROSALPINGOGRAM
TECHNIQUE: Following cleansing of the cervix and vagina with Betadine solution,
a hysterosalpingogram was performed using a 5-French
hysterosalpingogram catheter and Omnipaque 300 contrast. The patient
tolerated the examination without difficulty.

[Series 1: cp_standard · 0.26mm/px · 2 of 2 slices shown]
[im 1/2]
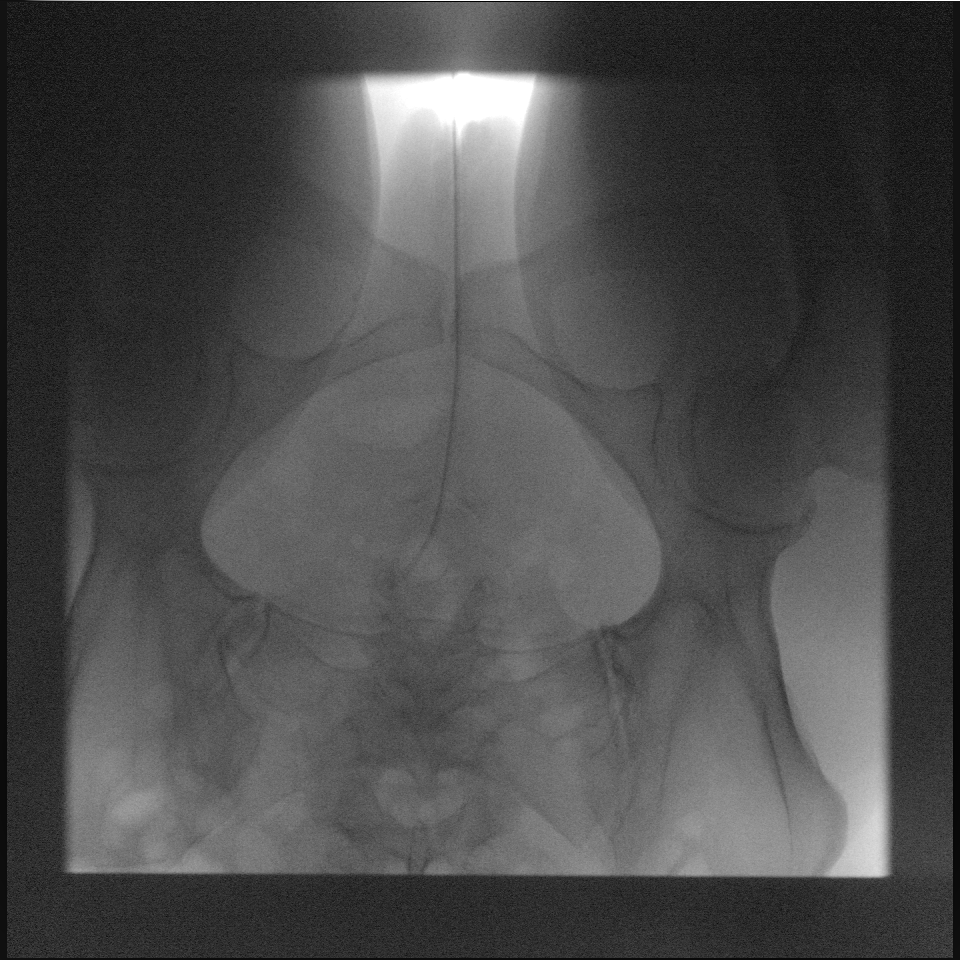
[im 2/2]
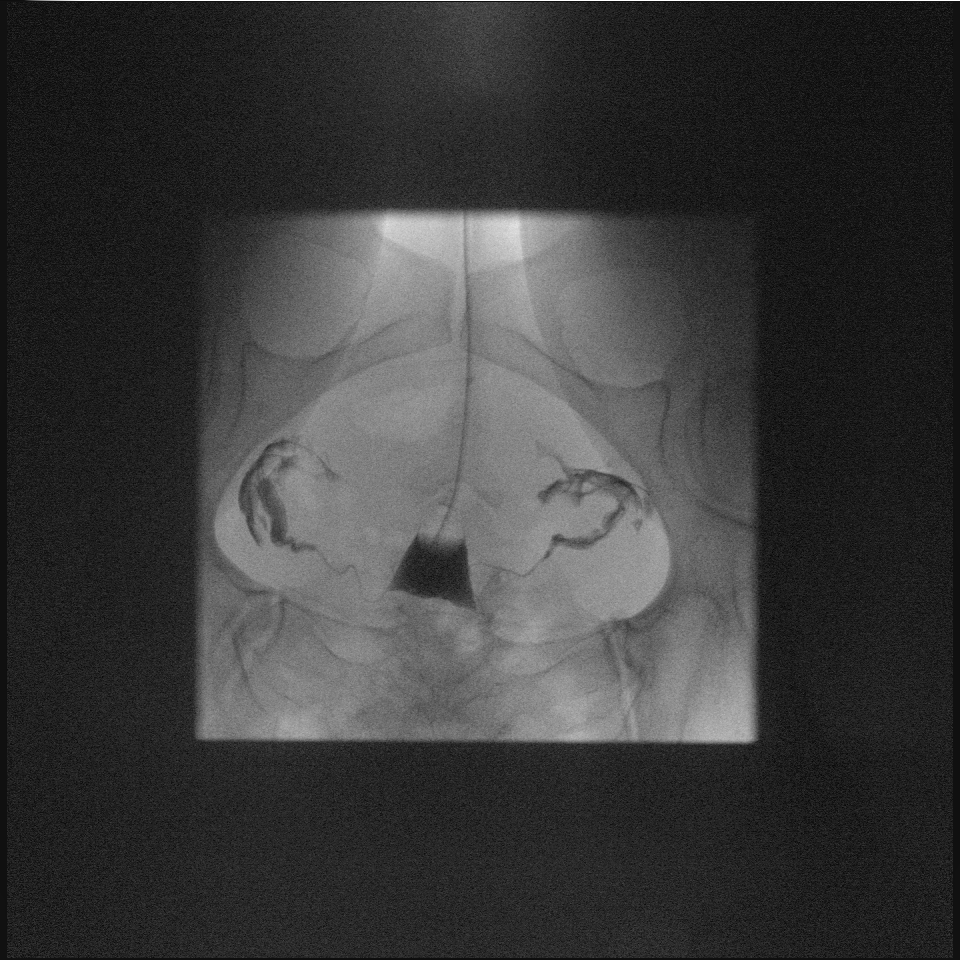

[2 of 2 positions shown; findings below may reference images not displayed]

FLUOROSCOPY TIME:  Radiation Exposure Index (as provided by the
fluoroscopic device): 18 seconds

If the device does not provide the exposure index:

Fluoroscopy Time:  1.9 mGy

Number of Acquired Images:  1
FINDINGS: Dr. Nosa inserted a catheter into the cervix. 5 mL Isovue 370
contrast was then instilled into the uterus using intermittent pulse
fluoroscopy.

The uterus demonstrates normal contour, orientation, and morphology
with no filling defects. The fallopian tubes fill bilaterally
demonstrating no filling defects or stenoses. There was free
spillage of contrast from the fallopian tubes into the
intraperitoneal cavity.
IMPRESSION: Normal hysterosalpingogram.

## 2018-04-27 DIAGNOSIS — O4691 Antepartum hemorrhage, unspecified, first trimester: Secondary | ICD-10-CM | POA: Diagnosis not present

## 2018-04-28 DIAGNOSIS — Z0000 Encounter for general adult medical examination without abnormal findings: Secondary | ICD-10-CM | POA: Diagnosis not present

## 2018-05-09 DIAGNOSIS — O4691 Antepartum hemorrhage, unspecified, first trimester: Secondary | ICD-10-CM | POA: Diagnosis not present

## 2018-05-11 DIAGNOSIS — O0900 Supervision of pregnancy with history of infertility, unspecified trimester: Secondary | ICD-10-CM | POA: Diagnosis not present

## 2018-05-20 ENCOUNTER — Encounter: Payer: Self-pay | Admitting: Obstetrics and Gynecology

## 2018-05-20 ENCOUNTER — Ambulatory Visit (INDEPENDENT_AMBULATORY_CARE_PROVIDER_SITE_OTHER): Payer: 59 | Admitting: Obstetrics and Gynecology

## 2018-05-20 ENCOUNTER — Other Ambulatory Visit (HOSPITAL_COMMUNITY)
Admission: RE | Admit: 2018-05-20 | Discharge: 2018-05-20 | Disposition: A | Discharge status: Home / Self Care | Payer: 59 | Source: Ambulatory Visit | Attending: Obstetrics and Gynecology | Admitting: Obstetrics and Gynecology

## 2018-05-20 VITALS — BP 110/64 | Wt 135.0 lb

## 2018-05-20 DIAGNOSIS — Z331 Pregnant state, incidental: Secondary | ICD-10-CM | POA: Diagnosis not present

## 2018-05-20 DIAGNOSIS — N750 Cyst of Bartholin's gland: Secondary | ICD-10-CM | POA: Diagnosis not present

## 2018-05-20 DIAGNOSIS — O09521 Supervision of elderly multigravida, first trimester: Secondary | ICD-10-CM

## 2018-05-20 DIAGNOSIS — Z1389 Encounter for screening for other disorder: Secondary | ICD-10-CM

## 2018-05-20 DIAGNOSIS — Z3A10 10 weeks gestation of pregnancy: Secondary | ICD-10-CM | POA: Diagnosis not present

## 2018-05-20 DIAGNOSIS — O0991 Supervision of high risk pregnancy, unspecified, first trimester: Secondary | ICD-10-CM | POA: Insufficient documentation

## 2018-05-20 LAB — POCT URINALYSIS DIPSTICK OB: Glucose, UA: NEGATIVE

## 2018-05-20 MED ORDER — CEPHALEXIN 500 MG PO CAPS: 500.0000 mg | ORAL_CAPSULE | Freq: Four times a day (QID) | ORAL | 0 refills | Status: DC

## 2018-05-20 NOTE — Progress Notes (Signed)
ROB C/o Nausea not vomiting, fatigue, some spotting last time was two weeks.  Would like u/s to put herself at ease sense last miscarriage was around this time.

## 2018-05-20 NOTE — Progress Notes (Signed)
05/20/2018   Chief Complaint: Missed period  Transfer of Care Patient: no  History of Present Illness: Morgan Sullivan is a 37 y.o. [id number] Unknown based on No LMP recorded. with an Estimated Date of Delivery: None noted., with the above CC.   She has an IVF pregnnacy She was using no method when she conceived.  She has Negative signs or symptoms of nausea/vomiting of pregnancy. She has Negative signs or symptoms of miscarriage or preterm labor She identifies Negative Zika risk factors for her and her partner On any different medications around the time she conceived/early pregnancy: No  History of varicella: No   ROS: A 12-point review of systems was performed and negative, except as stated in the above HPI.  OBGYN History: As per HPI. OB History  Gravida Para Term Preterm AB Living  4 2 2   1 2   SAB TAB Ectopic Multiple Live Births  1       2    # Outcome Date GA Lbr Len/2nd Weight Sex Delivery Anes PTL Lv  4 Gravida           3 Term 11/24/13   7 lb (3.175 kg) M Vag-Spont  N LIV  2 Term 12/26/06   5 lb 14 oz (2.665 kg) F Vag-Spont  N LIV  1 SAB             Any issues with any prior pregnancies: no Any prior children are healthy, doing well, without any problems or issues: yes History of pap smears: Yes. Last pap smear 2018 NIL. Abnormal: no   History of STIs: No   Past Medical History: Past Medical History:  Diagnosis Date  . Depression     Past Surgical History: No past surgical history on file.  Family History:  Family History  Problem Relation Age of Onset  . Skin cancer Mother 32  . Alzheimer's disease Maternal Grandmother   . Prostate cancer Maternal Grandfather   . Colon cancer Paternal Grandfather    She denies any female cancers, bleeding or blood clotting disorders.  She denies any history of mental retardation, birth defects or genetic disorders in her or the FOB's history  Social History:  Social History   Socioeconomic History  . Marital status:  Married    Spouse name: Not on file  . Number of children: Not on file  . Years of education: Not on file  . Highest education level: Not on file  Occupational History  . Not on file  Social Needs  . Financial resource strain: Not on file  . Food insecurity:    Worry: Not on file    Inability: Not on file  . Transportation needs:    Medical: Not on file    Non-medical: Not on file  Tobacco Use  . Smoking status: Former Research scientist (life sciences)  . Smokeless tobacco: Never Used  Substance and Sexual Activity  . Alcohol use: Yes    Alcohol/week: 1.0 standard drinks    Types: 1 Glasses of wine per week    Comment: not during pregnancy  . Drug use: No  . Sexual activity: Yes    Partners: Male  Lifestyle  . Physical activity:    Days per week: Not on file    Minutes per session: Not on file  . Stress: Not on file  Relationships  . Social connections:    Talks on phone: Not on file    Gets together: Not on file    Attends religious service:  Not on file    Active member of club or organization: Not on file    Attends meetings of clubs or organizations: Not on file    Relationship status: Not on file  . Intimate partner violence:    Fear of current or ex partner: Not on file    Emotionally abused: Not on file    Physically abused: Not on file    Forced sexual activity: Not on file  Other Topics Concern  . Not on file  Social History Narrative  . Not on file   Any pets in the household: no   Allergy: Allergies  Allergen Reactions  . Oxycodone-Acetaminophen Nausea And Vomiting    Current Outpatient Medications:  Current Outpatient Medications:  .  medroxyPROGESTERone (PROVERA) 10 MG tablet, Take 1 tablet (10 mg total) by mouth daily for 10 days., Disp: 10 tablet, Rfl: 0 .  Prenatal Vit-Fe Fumarate-FA (MULTIVITAMIN-PRENATAL) 27-0.8 MG TABS tablet, Take 1 tablet by mouth daily at 12 noon., Disp: , Rfl:  .  sertraline (ZOLOFT) 100 MG tablet, Take 1 tablet (100 mg total) by mouth  daily., Disp: 30 tablet, Rfl: 11   Physical Exam:   There were no vitals taken for this visit. There is no height or weight on file to calculate BMI. Constitutional: Well nourished, well developed female in no acute distress.  Neck:  Supple, normal appearance, and no thyromegaly  Cardiovascular: S1, S2 normal, no murmur, rub or gallop, regular rate and rhythm Respiratory:  Clear to auscultation bilateral. Normal respiratory effort Abdomen: positive bowel sounds and no masses, hernias; diffusely non tender to palpation, non distended Breasts: breasts appear normal, no suspicious masses, no skin or nipple changes or axillary nodes. Neuro/Psych:  Normal mood and affect.  Skin:  Warm and dry.  Lymphatic:  No inguinal lymphadenopathy.   Pelvic exam: is not limited by body habitus EGBUS: within normal limits, Vagina: within normal limits and with no blood in the vault, Cervix: normal appearing cervix without discharge or lesions,  closed/long/high, Vulva: Bartholin gland cyst on right vulva.  closed/long/high, Uterus:  nonenlarged, and Adnexa:  normal adnexa  Bedside US showed IUP with CRL of 9 weeks 5 days GA. FHR 172 bpm.   Assessment: Morgan Sullivan is a 37 y.o. [id number] Unknown based on No LMP recorded. with an Estimated Date of Delivery: None noted.,  for prenatal care.  Plan:  1) Avoid alcoholic beverages. 2) Patient encouraged not to smoke.  3) Discontinue the use of all non-medicinal drugs and chemicals.  4) Take prenatal vitamins daily.  5) Seatbelt use advised 6) Nutrition, food safety (fish, cheese advisories, and high nitrite foods) and exercise discussed. 7) Hospital and practice style delivering at [facility] discussed  8) Patient is asked about travel to areas at risk for the [place] virus, and counseled to avoid travel and exposure to mosquitoes or sexual partners who may have themselves been exposed to the virus. Testing is discussed, and will be ordered as appropriate.  9) Childbirth  classes at [facility] advised 10) Genetic Screening, such as with 1st Trimester Screening, cell free fetal DNA, AFP testing, and Ultrasound, as well as with amniocentesis and CVS as appropriate, is discussed with patient. She plans to decline genetic testing this pregnancy. She reports that she had normal preimplantation screening and does not wish to repeat the screening especially because she would not consider a termination.  11) Bartholin glands cyst- discussed loose fitting clothing, showering after working out and sitz baths. Will given 1 week  of keflex for treatment, too deep to drain at this time.   Problem list reviewed and updated.  Adrian Prows MD [facility] 05/20/18 8:37 AM

## 2018-05-20 NOTE — Patient Instructions (Signed)
How to Take a Sitz Bath °A sitz bath is a warm water bath that is taken while you are sitting down. The water should only come up to your hips and should cover your buttocks. Your health care provider may recommend a sitz bath to help you: °· Clean the lower part of your body, including your genital area. °· With itching. °· With pain. °· With sore muscles or muscles that tighten or spasm. ° °How to take a sitz bath °Take 3-4 sitz baths per day or as told by your health care provider. °1. Partially fill a bathtub with warm water. You will only need the water to be deep enough to cover your hips and buttocks when you are sitting in it. °2. If your health care provider told you to put medicine in the water, follow the directions exactly. °3. Sit in the water and open the tub drain a little. °4. Turn on the warm water again to keep the tub at the correct level. Keep the water running constantly. °5. Soak in the water for 15-20 minutes or as told by your health care provider. °6. After the sitz bath, pat the affected area dry first. Do not rub it. °7. Be careful when you stand up after the sitz bath because you may feel dizzy. ° °Contact a health care provider if: °· Your symptoms get worse. Do not continue with sitz baths if your symptoms get worse. °· You have new symptoms. Do not continue with sitz baths until you talk with your health care provider. °This information is not intended to replace advice given to you by your health care provider. Make sure you discuss any questions you have with your health care provider. °Document Released: 05/30/2004 Document Revised: 02/05/2016 Document Reviewed: 09/05/2014 °Elsevier Interactive Patient Education © 2018 [employer] ° °

## 2018-05-21 LAB — RPR+RH+ABO+RUB AB+AB SCR+CB...
Antibody Screen: NEGATIVE
HEMOGLOBIN: 13.6 g/dL (ref 11.1–15.9)
HIV Screen 4th Generation wRfx: NONREACTIVE
Hematocrit: 40.8 % (ref 34.0–46.6)
Hepatitis B Surface Ag: NEGATIVE
MCH: 29 pg (ref 26.6–33.0)
MCHC: 33.3 g/dL (ref 31.5–35.7)
MCV: 87 fL (ref 79–97)
PLATELETS: 297 10*3/uL (ref 150–450)
RBC: 4.69 x10E6/uL (ref 3.77–5.28)
RDW: 12.7 % (ref 12.3–15.4)
RPR: NONREACTIVE
RUBELLA: 3.29 {index} (ref >0.99)
Rh Factor: POSITIVE
VARICELLA: 148 {index} — AB (ref >165)
WBC: 8.3 10*3/uL (ref 3.4–10.8)

## 2018-05-21 LAB — URINE DRUG PANEL 7
Amphetamines, Urine: NEGATIVE ng/mL
BENZODIAZEPINE QUANT UR: NEGATIVE ng/mL
Barbiturate Quant, Ur: NEGATIVE ng/mL
COCAINE (METAB.): NEGATIVE ng/mL
Cannabinoid Quant, Ur: NEGATIVE ng/mL
Opiate Quant, Ur: NEGATIVE ng/mL
PCP QUANT UR: NEGATIVE ng/mL

## 2018-05-22 LAB — URINE CULTURE

## 2018-05-24 NOTE — Progress Notes (Signed)
Varicella non immune, otherwise normal, released to [employer]

## 2018-05-25 LAB — CERVICOVAGINAL ANCILLARY ONLY
Chlamydia: NEGATIVE
NEISSERIA GONORRHEA: NEGATIVE
Trichomonas: NEGATIVE

## 2018-06-03 ENCOUNTER — Other Ambulatory Visit (INDEPENDENT_AMBULATORY_CARE_PROVIDER_SITE_OTHER): Payer: 59

## 2018-06-03 ENCOUNTER — Ambulatory Visit (INDEPENDENT_AMBULATORY_CARE_PROVIDER_SITE_OTHER): Payer: 59 | Admitting: Maternal Newborn

## 2018-06-03 VITALS — BP 110/64 | Wt 137.0 lb

## 2018-06-03 DIAGNOSIS — Z3A11 11 weeks gestation of pregnancy: Secondary | ICD-10-CM

## 2018-06-03 DIAGNOSIS — Z3A12 12 weeks gestation of pregnancy: Secondary | ICD-10-CM

## 2018-06-03 DIAGNOSIS — O0991 Supervision of high risk pregnancy, unspecified, first trimester: Secondary | ICD-10-CM

## 2018-06-03 DIAGNOSIS — O034 Incomplete spontaneous abortion without complication: Secondary | ICD-10-CM | POA: Diagnosis not present

## 2018-06-03 DIAGNOSIS — O3680X0 Pregnancy with inconclusive fetal viability, not applicable or unspecified: Secondary | ICD-10-CM

## 2018-06-03 LAB — POCT URINALYSIS DIPSTICK OB
GLUCOSE, UA: NEGATIVE
POC,PROTEIN,UA: NEGATIVE

## 2018-06-03 NOTE — Progress Notes (Signed)
ROB- no concerns 

## 2018-06-06 ENCOUNTER — Ambulatory Visit (INDEPENDENT_AMBULATORY_CARE_PROVIDER_SITE_OTHER): Payer: 59 | Admitting: Obstetrics and Gynecology

## 2018-06-06 ENCOUNTER — Encounter: Payer: Self-pay | Admitting: Obstetrics and Gynecology

## 2018-06-06 ENCOUNTER — Ambulatory Visit (INDEPENDENT_AMBULATORY_CARE_PROVIDER_SITE_OTHER): Payer: 59

## 2018-06-06 ENCOUNTER — Other Ambulatory Visit: Payer: Self-pay | Admitting: Obstetrics and Gynecology

## 2018-06-06 VITALS — BP 118/74 | Ht 62.0 in | Wt 138.0 lb

## 2018-06-06 DIAGNOSIS — O021 Missed abortion: Secondary | ICD-10-CM

## 2018-06-06 DIAGNOSIS — Z3A11 11 weeks gestation of pregnancy: Secondary | ICD-10-CM

## 2018-06-06 NOTE — Progress Notes (Signed)
Pre-operative History and Physical   Chief Complaint  Patient presents with  . Follow-up    u/s follow up  Missed abortion  History of Present Illness: Patient is a 37 y.o. female who presents today for ultrasound evaluation of missed abortion .  Ultrasound demonstrates the following findings Adnexa: no masses seen  Single intrauterine pregnancy with crown-rump length 4.7 cm (down from 5.2 cm) on Friday. No cardiac activity noted.   US Ob Comp Less 14 Wks  Result Date: 06/06/2018 Patient Name: Morgan Sullivan DOB: [birth year] MRN: [id number] ULTRASOUND REPORT Location: [facility] Date of Service: 06/06/2018 Indications:Viability Findings: Nelda Marseille NON viable intrauterine pregnancy is visualized with a CRL consistent with [redacted]w[redacted]d gestation, giving an (U/S) EDD of 12/22/18. The (U/S) EDD is consistent with the clinically established EDD of 12/16/18. FHR: NO CRL measurement: 47.2 mm Yolk sac is not visualized. Amnion: visualized Right Ovary is normal in appearance. Left Ovary is normal appearance. Corpus luteal cyst:  is not visualized Survey of the adnexa demonstrates no adnexal masses. There is no free peritoneal fluid in the cul de sac. Two [facility] are seen measuring 1.31 x 0.66 cm and 2.04 x 0.76 cm. Impression: 1. [redacted]w[redacted]d NON Viable Singleton Intrauterine pregnancy by U/S*. 2. (U/S) EDD is consistent with Clinically established EDD of 12/16/18 3 Two [facility] are seen measuring 1.31 x 0.66 cm and 2.04 x 0.76 cm. [name], RDMS *Criteria from the Society of Radiologists in [place] on Early First Trimester Diagnosis of Miscarriage and Exclusion of a Viable Intrauterine Pregnancy, October 2012. The ultrasound images and findings were reviewed by me and I agree with the above report. Prentice Docker, MD, [facility], [facility] 06/06/2018 1:17 PM      Past Medical History:  Diagnosis Date  . Depression     Past Surgical History:    Procedure Laterality Date  . NO PAST SURGERIES     Family History  Problem Relation Age of Onset  . Skin cancer Mother 7  . Alzheimer's disease Maternal Grandmother   . Prostate cancer Maternal Grandfather   . Colon cancer Paternal Grandfather     Social History   Socioeconomic History  . Marital status: Married    Spouse name: Not on file  . Number of children: Not on file  . Years of education: Not on file  . Highest education level: Not on file  Occupational History  . Not on file  Social Needs  . Financial resource strain: Not on file  . Food insecurity:    Worry: Not on file    Inability: Not on file  . Transportation needs:    Medical: Not on file    Non-medical: Not on file  Tobacco Use  . Smoking status: Former Research scientist (life sciences)  . Smokeless tobacco: Never Used  Substance and Sexual Activity  . Alcohol use: Yes    Alcohol/week: 1.0 standard drinks    Types: 1 Glasses of wine per week    Comment: not during pregnancy  . Drug use: No  . Sexual activity: Yes    Partners: Male    Birth control/protection: None  Lifestyle  . Physical activity:    Days per week: Not on file    Minutes per session: Not on file  . Stress: Not on file  Relationships  . Social connections:    Talks on phone: Not on file    Gets together: Not on file    Attends religious service: Not  on file    Active member of club or organization: Not on file    Attends meetings of clubs or organizations: Not on file    Relationship status: Not on file  . Intimate partner violence:    Fear of current or ex partner: Not on file    Emotionally abused: Not on file    Physically abused: Not on file    Forced sexual activity: Not on file  Other Topics Concern  . Not on file  Social History Narrative  . Not on file    Allergies  Allergen Reactions  . Oxycodone-Acetaminophen Nausea And Vomiting    Prior to Admission medications   Medication Sig Start Date End Date Taking? Authorizing Provider   sertraline (ZOLOFT) 100 MG tablet Take 1 tablet (100 mg total) by mouth daily. 04/06/18   Malachy Mood, MD    Physical Exam BP 118/74   Ht 5\' 2"  (1.575 m)   Wt 138 lb (62.6 kg)   LMP 03/11/2018 (Approximate)   BMI 25.24 kg/m    General: NAD HEENT: normocephalic, anicteric CV: RRR Pulmonary: No increased work of breathing, CTAB Extremities: no edema, erythema, or tenderness Neurologic: Grossly intact, normal gait Psychiatric: mood appropriate, affect full  Assessment: 37 y.o. [id number]  1. Missed abortion      Plan: Problem List Items Addressed This Visit    None    Visit Diagnoses    Missed abortion    -  Primary     Condolences were offered to the patient and her family.  I stressed that while emotionally difficult, that this did not occur because of an actions or inactions by the patient.  Somewhere between 10-20% of identified first trimester pregnancies will unfortunately end in miscarriage.  Given this relatively high incidence rate, further diagnostic testing such as chromosome analysis is generally not clinically relevant nor recommended.  Although the chromosomal abnormalities have been implicated at rates as high as 70% in some studies, these are generally random and do not infer and increased risk of recurrence with subsequent pregnancies.  However, 3 or more consecutive first trimester losses are relatively uncommon, and these patient generally do benefit from additional work up to determine a potential modifiable etiology.   We briefly discussed management options including expectant management, medical management, and surgical management as well as their relative success rates and complications. Approximately 80% of first trimester miscarriages will pass successfully but may require a time frame of up to 8 weeks ([place] 150 May 2015 "Early Pregnancy Loss").  Medical management using 817mcg of misoprostil administered every 3-hrs as needed for up to 3  doses speeds up the time frame to completion significantly, has literature supporting its use up to 63 days or [redacted]w[redacted]d gestation and results in a passage rate of 84-85% (ACOG Practice Bulletin 143 March 2014 "Medical Management of First-Trimester Abortion").  Dilation and curettage has the highest rate of uterine evacuation, but carries with is operative cost, surgical and anesthetic risk.  While these risk are relatively small they nevertheless include infection, bleeding, uterine perforation, formation of uterine synechia, and in rare cases death.   We discussed repeat ultrasound and or trending HCG levels if the patient wishes to pursue these prior to making her decision.  Clinically I am confident of the diagnosis, but I do not want any doubts in the patient's mind regarding the plan of management she chooses to adopt.  She elects suction, dilation and curettage.  She is already scheduled for  tomorrow for this procedure.   The patient is Rh positive and rhogam is therefore not indicated.    20 minutes spent in face to face discussion with > 50% spent in counseling,management, and coordination of care of her missed abortion.   Prentice Docker, MD, [facility], [facility] 06/06/2018 5:44 PM

## 2018-06-06 NOTE — Telephone Encounter (Signed)
Pt called nurse line as well re this.

## 2018-06-06 NOTE — H&P (View-Only) (Signed)
Pre-operative History and Physical   Chief Complaint  Patient presents with  . Follow-up    u/s follow up  Missed abortion  History of Present Illness: Patient is a 37 y.o. female who presents today for ultrasound evaluation of missed abortion .  Ultrasound demonstrates the following findings Adnexa: no masses seen  Single intrauterine pregnancy with crown-rump length 4.7 cm (down from 5.2 cm) on Friday. No cardiac activity noted.   US Ob Comp Less 14 Wks  Result Date: 06/06/2018 Patient Name: Morgan Sullivan DOB: 07-Dec-1980 MRN: [id number] ULTRASOUND REPORT Location: [facility] Date of Service: 06/06/2018 Indications:Viability Findings: Morgan Sullivan NON viable intrauterine pregnancy is visualized with a CRL consistent with [redacted]w[redacted]d gestation, giving an (U/S) EDD of 12/22/18. The (U/S) EDD is consistent with the clinically established EDD of 12/16/18. FHR: NO CRL measurement: 47.2 mm Yolk sac is not visualized. Amnion: visualized Right Ovary is normal in appearance. Left Ovary is normal appearance. Corpus luteal cyst:  is not visualized Survey of the adnexa demonstrates no adnexal masses. There is no free peritoneal fluid in the cul de sac. Two [facility] are seen measuring 1.31 x 0.66 cm and 2.04 x 0.76 cm. Impression: 1. [redacted]w[redacted]d NON Viable Singleton Intrauterine pregnancy by U/S*. 2. (U/S) EDD is consistent with Clinically established EDD of 12/16/18 3 Two [facility] are seen measuring 1.31 x 0.66 cm and 2.04 x 0.76 cm. Morgan Sullivan, RDMS *Criteria from the Society of Radiologists in [place] on Early First Trimester Diagnosis of Miscarriage and Exclusion of a Viable Intrauterine Pregnancy, October 2012. The ultrasound images and findings were reviewed by me and I agree with the above report. Morgan Docker, MD, [facility], [facility] 06/06/2018 1:17 PM      Past Medical History:  Diagnosis Date  . Depression     Past Surgical History:    Procedure Laterality Date  . NO PAST SURGERIES     Family History  Problem Relation Age of Onset  . Skin cancer Mother 43  . Alzheimer's disease Maternal Grandmother   . Prostate cancer Maternal Grandfather   . Colon cancer Paternal Grandfather     Social History   Socioeconomic History  . Marital status: Married    Spouse name: Not on file  . Number of children: Not on file  . Years of education: Not on file  . Highest education level: Not on file  Occupational History  . Not on file  Social Needs  . Financial resource strain: Not on file  . Food insecurity:    Worry: Not on file    Inability: Not on file  . Transportation needs:    Medical: Not on file    Non-medical: Not on file  Tobacco Use  . Smoking status: Former Research scientist (life sciences)  . Smokeless tobacco: Never Used  Substance and Sexual Activity  . Alcohol use: Yes    Alcohol/week: 1.0 standard drinks    Types: 1 Glasses of wine per week    Comment: not during pregnancy  . Drug use: No  . Sexual activity: Yes    Partners: Male    Birth control/protection: None  Lifestyle  . Physical activity:    Days per week: Not on file    Minutes per session: Not on file  . Stress: Not on file  Relationships  . Social connections:    Talks on phone: Not on file    Gets together: Not on file    Attends religious service: Not  on file    Active member of club or organization: Not on file    Attends meetings of clubs or organizations: Not on file    Relationship status: Not on file  . Intimate partner violence:    Fear of current or ex partner: Not on file    Emotionally abused: Not on file    Physically abused: Not on file    Forced sexual activity: Not on file  Other Topics Concern  . Not on file  Social History Narrative  . Not on file    Allergies  Allergen Reactions  . Oxycodone-Acetaminophen Nausea And Vomiting    Prior to Admission medications   Medication Sig Start Date End Date Taking? Authorizing Provider   sertraline (ZOLOFT) 100 MG tablet Take 1 tablet (100 mg total) by mouth daily. 04/06/18   Malachy Mood, MD    Physical Exam BP 118/74   Ht 5\' 2"  (1.575 m)   Wt 138 lb (62.6 kg)   LMP 03/11/2018 (Approximate)   BMI 25.24 kg/m    General: NAD HEENT: normocephalic, anicteric CV: RRR Pulmonary: No increased work of breathing, CTAB Extremities: no edema, erythema, or tenderness Neurologic: Grossly intact, normal gait Psychiatric: mood appropriate, affect full  Assessment: 37 y.o. [id number]  1. Missed abortion      Plan: Problem List Items Addressed This Visit    None    Visit Diagnoses    Missed abortion    -  Primary     Condolences were offered to the patient and her family.  I stressed that while emotionally difficult, that this did not occur because of an actions or inactions by the patient.  Somewhere between 10-20% of identified first trimester pregnancies will unfortunately end in miscarriage.  Given this relatively high incidence rate, further diagnostic testing such as chromosome analysis is generally not clinically relevant nor recommended.  Although the chromosomal abnormalities have been implicated at rates as high as 70% in some studies, these are generally random and do not infer and increased risk of recurrence with subsequent pregnancies.  However, 3 or more consecutive first trimester losses are relatively uncommon, and these patient generally do benefit from additional work up to determine a potential modifiable etiology.   We briefly discussed management options including expectant management, medical management, and surgical management as well as their relative success rates and complications. Approximately 80% of first trimester miscarriages will pass successfully but may require a time frame of up to 8 weeks ([place] 150 May 2015 "Early Pregnancy Loss").  Medical management using 843mcg of misoprostil administered every 3-hrs as needed for up to 3  doses speeds up the time frame to completion significantly, has literature supporting its use up to 63 days or [redacted]w[redacted]d gestation and results in a passage rate of 84-85% (ACOG Practice Bulletin 143 March 2014 "Medical Management of First-Trimester Abortion").  Dilation and curettage has the highest rate of uterine evacuation, but carries with is operative cost, surgical and anesthetic risk.  While these risk are relatively small they nevertheless include infection, bleeding, uterine perforation, formation of uterine synechia, and in rare cases death.   We discussed repeat ultrasound and or trending HCG levels if the patient wishes to pursue these prior to making her decision.  Clinically I am confident of the diagnosis, but I do not want any doubts in the patient's mind regarding the plan of management she chooses to adopt.  She elects suction, dilation and curettage.  She is already scheduled for  tomorrow for this procedure.   The patient is Rh positive and rhogam is therefore not indicated.    20 minutes spent in face to face discussion with > 50% spent in counseling,management, and coordination of care of her missed abortion.   Morgan Docker, MD, [facility], [facility] 06/06/2018 5:44 PM

## 2018-06-07 ENCOUNTER — Encounter
Admission: RE | Disposition: A | Discharge status: Home / Self Care | Payer: Self-pay | Source: Ambulatory Visit | Attending: Obstetrics and Gynecology

## 2018-06-07 ENCOUNTER — Other Ambulatory Visit: Payer: Self-pay

## 2018-06-07 ENCOUNTER — Ambulatory Visit: Payer: 59 | Admitting: Registered Nurse

## 2018-06-07 ENCOUNTER — Observation Stay
Admission: RE | Admit: 2018-06-07 | Discharge: 2018-06-08 | Disposition: A | Discharge status: Home / Self Care | Payer: 59 | Source: Ambulatory Visit | Attending: Obstetrics and Gynecology | Admitting: Obstetrics and Gynecology

## 2018-06-07 ENCOUNTER — Encounter: Payer: Self-pay | Admitting: Maternal Newborn

## 2018-06-07 DIAGNOSIS — Z87891 Personal history of nicotine dependence: Secondary | ICD-10-CM | POA: Diagnosis not present

## 2018-06-07 DIAGNOSIS — O034 Incomplete spontaneous abortion without complication: Secondary | ICD-10-CM | POA: Diagnosis not present

## 2018-06-07 DIAGNOSIS — Y838 Other surgical procedures as the cause of abnormal reaction of the patient, or of later complication, without mention of misadventure at the time of the procedure: Secondary | ICD-10-CM | POA: Insufficient documentation

## 2018-06-07 DIAGNOSIS — N99820 Postprocedural hemorrhage and hematoma of a genitourinary system organ or structure following a genitourinary system procedure: Secondary | ICD-10-CM | POA: Diagnosis not present

## 2018-06-07 DIAGNOSIS — Z8489 Family history of other specified conditions: Secondary | ICD-10-CM | POA: Insufficient documentation

## 2018-06-07 DIAGNOSIS — Z79899 Other long term (current) drug therapy: Secondary | ICD-10-CM | POA: Insufficient documentation

## 2018-06-07 DIAGNOSIS — O021 Missed abortion: Principal | ICD-10-CM | POA: Insufficient documentation

## 2018-06-07 DIAGNOSIS — Y92239 Unspecified place in hospital as the place of occurrence of the external cause: Secondary | ICD-10-CM | POA: Insufficient documentation

## 2018-06-07 DIAGNOSIS — R55 Syncope and collapse: Secondary | ICD-10-CM | POA: Diagnosis not present

## 2018-06-07 DIAGNOSIS — F329 Major depressive disorder, single episode, unspecified: Secondary | ICD-10-CM | POA: Diagnosis not present

## 2018-06-07 DIAGNOSIS — N939 Abnormal uterine and vaginal bleeding, unspecified: Secondary | ICD-10-CM | POA: Diagnosis not present

## 2018-06-07 HISTORY — DX: Family history of other specified conditions: Z84.89

## 2018-06-07 HISTORY — PX: DILATION AND EVACUATION: SHX1459

## 2018-06-07 LAB — CBC
HCT: 26.5 % — ABNORMAL LOW (ref 35.0–47.0)
HCT: 25.8 % — ABNORMAL LOW (ref 35.0–47.0)
HEMOGLOBIN: 9 g/dL — AB (ref 12.0–16.0)
Hemoglobin: 9.2 g/dL — ABNORMAL LOW (ref 12.0–16.0)
MCH: 30.6 pg (ref 26.0–34.0)
MCH: 30.2 pg (ref 26.0–34.0)
MCHC: 34.8 g/dL (ref 32.0–36.0)
MCHC: 35.1 g/dL (ref 32.0–36.0)
MCV: 88 fL (ref 80.0–100.0)
MCV: 86.1 fL (ref 80.0–100.0)
Platelets: 170 10*3/uL (ref 150–440)
Platelets: 215 10*3/uL (ref 150–440)
RBC: 3.01 MIL/uL — AB (ref 3.80–5.20)
RBC: 3 MIL/uL — ABNORMAL LOW (ref 3.80–5.20)
RDW: 13.2 % (ref 11.5–14.5)
RDW: 13.2 % (ref 11.5–14.5)
WBC: 12.2 10*3/uL — AB (ref 3.6–11.0)
WBC: 12.4 10*3/uL — AB (ref 3.6–11.0)

## 2018-06-07 LAB — ABO/RH: ABO/RH(D): O POS

## 2018-06-07 SURGERY — DILATION AND EVACUATION, UTERUS
Anesthesia: General

## 2018-06-07 MED ORDER — MIDAZOLAM HCL 2 MG/2ML IJ SOLN
1.0000 mg | Freq: Once | INTRAMUSCULAR | Status: AC
  Administered 2018-06-07: 1 mg via INTRAVENOUS

## 2018-06-07 MED ORDER — SODIUM CHLORIDE 0.9 % IV SOLN
INTRAVENOUS | Status: DC | PRN
  Administered 2018-06-07: 12:00:00 via INTRAVENOUS

## 2018-06-07 MED ORDER — MIDAZOLAM HCL 2 MG/2ML IJ SOLN
INTRAMUSCULAR | Status: AC
  Filled 2018-06-07: qty 2

## 2018-06-07 MED ORDER — TRANEXAMIC ACID 1000 MG/10ML IV SOLN
INTRAVENOUS | Status: DC | PRN
  Administered 2018-06-07: 1000 mg via INTRAVENOUS

## 2018-06-07 MED ORDER — METHYLERGONOVINE MALEATE 0.2 MG PO TABS
0.2000 mg | ORAL_TABLET | Freq: Four times a day (QID) | ORAL | Status: DC
  Administered 2018-06-07 – 2018-06-08 (×4): 0.2 mg via ORAL
  Filled 2018-06-07 (×4): qty 1

## 2018-06-07 MED ORDER — IBUPROFEN 600 MG PO TABS
600.0000 mg | ORAL_TABLET | Freq: Four times a day (QID) | ORAL | Status: DC | PRN
  Administered 2018-06-07 – 2018-06-08 (×4): 600 mg via ORAL
  Filled 2018-06-07 (×4): qty 1

## 2018-06-07 MED ORDER — DOXYCYCLINE HYCLATE 100 MG IV SOLR
200.0000 mg | INTRAVENOUS | Status: AC
  Administered 2018-06-07: 200 mg via INTRAVENOUS
  Filled 2018-06-07: qty 200

## 2018-06-07 MED ORDER — PROPOFOL 10 MG/ML IV BOLUS
INTRAVENOUS | Status: DC | PRN
  Administered 2018-06-07: 130 mg via INTRAVENOUS
  Administered 2018-06-07: 50 mg via INTRAVENOUS

## 2018-06-07 MED ORDER — LIDOCAINE HCL (CARDIAC) PF 100 MG/5ML IV SOSY
PREFILLED_SYRINGE | INTRAVENOUS | Status: DC | PRN
  Administered 2018-06-07: 80 mg via INTRAVENOUS

## 2018-06-07 MED ORDER — SERTRALINE HCL 100 MG PO TABS
100.0000 mg | ORAL_TABLET | Freq: Every day | ORAL | Status: DC
  Administered 2018-06-07: 100 mg via ORAL
  Filled 2018-06-07: qty 1

## 2018-06-07 MED ORDER — ONDANSETRON HCL 4 MG/2ML IJ SOLN
INTRAMUSCULAR | Status: AC
  Filled 2018-06-07: qty 2

## 2018-06-07 MED ORDER — MENTHOL 3 MG MT LOZG
1.0000 | LOZENGE | OROMUCOSAL | Status: DC | PRN
  Filled 2018-06-07: qty 9

## 2018-06-07 MED ORDER — HYDROCODONE-ACETAMINOPHEN 5-325 MG PO TABS: 1.0000 | ORAL_TABLET | Freq: Four times a day (QID) | ORAL | Status: DC | PRN

## 2018-06-07 MED ORDER — SUGAMMADEX SODIUM 200 MG/2ML IV SOLN
INTRAVENOUS | Status: AC
  Filled 2018-06-07: qty 2

## 2018-06-07 MED ORDER — DEXAMETHASONE SODIUM PHOSPHATE 10 MG/ML IJ SOLN
INTRAMUSCULAR | Status: AC
  Filled 2018-06-07: qty 1

## 2018-06-07 MED ORDER — GLYCOPYRROLATE 0.2 MG/ML IJ SOLN
INTRAMUSCULAR | Status: DC | PRN
  Administered 2018-06-07: 0.2 mg via INTRAVENOUS

## 2018-06-07 MED ORDER — SIMETHICONE 80 MG PO CHEW: 80.0000 mg | CHEWABLE_TABLET | Freq: Four times a day (QID) | ORAL | Status: DC | PRN

## 2018-06-07 MED ORDER — GLYCOPYRROLATE 0.2 MG/ML IJ SOLN
INTRAMUSCULAR | Status: AC
  Filled 2018-06-07: qty 1

## 2018-06-07 MED ORDER — ACETAMINOPHEN NICU IV SYRINGE 10 MG/ML
INTRAVENOUS | Status: AC
  Filled 2018-06-07: qty 1

## 2018-06-07 MED ORDER — SODIUM CHLORIDE 0.9% IV SOLUTION: Freq: Once | INTRAVENOUS | Status: DC

## 2018-06-07 MED ORDER — LIDOCAINE HCL (PF) 2 % IJ SOLN
INTRAMUSCULAR | Status: AC
  Filled 2018-06-07: qty 10

## 2018-06-07 MED ORDER — ROCURONIUM BROMIDE 100 MG/10ML IV SOLN
INTRAVENOUS | Status: DC | PRN
  Administered 2018-06-07: 10 mg via INTRAVENOUS

## 2018-06-07 MED ORDER — ROCURONIUM BROMIDE 50 MG/5ML IV SOLN
INTRAVENOUS | Status: AC
  Filled 2018-06-07: qty 1

## 2018-06-07 MED ORDER — METHYLERGONOVINE MALEATE 0.2 MG/ML IJ SOLN
INTRAMUSCULAR | Status: AC
  Filled 2018-06-07: qty 1

## 2018-06-07 MED ORDER — LACTATED RINGERS IV SOLN
INTRAVENOUS | Status: DC
  Administered 2018-06-07 (×2): via INTRAVENOUS

## 2018-06-07 MED ORDER — LACTATED RINGERS IV BOLUS
500.0000 mL | Freq: Once | INTRAVENOUS | Status: AC
  Administered 2018-06-07: 500 mL via INTRAVENOUS

## 2018-06-07 MED ORDER — PHENYLEPHRINE HCL 10 MG/ML IJ SOLN
INTRAMUSCULAR | Status: DC | PRN
  Administered 2018-06-07: 200 ug via INTRAVENOUS
  Administered 2018-06-07 (×3): 100 ug via INTRAVENOUS
  Administered 2018-06-07 (×2): 200 ug via INTRAVENOUS

## 2018-06-07 MED ORDER — SEVOFLURANE IN SOLN
RESPIRATORY_TRACT | Status: AC
  Filled 2018-06-07: qty 250

## 2018-06-07 MED ORDER — SODIUM CHLORIDE 0.9 % IJ SOLN
INTRAMUSCULAR | Status: AC
  Filled 2018-06-07: qty 10

## 2018-06-07 MED ORDER — FENTANYL CITRATE (PF) 100 MCG/2ML IJ SOLN: 25.0000 ug | INTRAMUSCULAR | Status: DC | PRN

## 2018-06-07 MED ORDER — DEXAMETHASONE SODIUM PHOSPHATE 10 MG/ML IJ SOLN
INTRAMUSCULAR | Status: DC | PRN
  Administered 2018-06-07: 10 mg via INTRAVENOUS

## 2018-06-07 MED ORDER — SODIUM CHLORIDE FLUSH 0.9 % IV SOLN
INTRAVENOUS | Status: AC
  Filled 2018-06-07: qty 3

## 2018-06-07 MED ORDER — PROPOFOL 10 MG/ML IV BOLUS
INTRAVENOUS | Status: AC
  Filled 2018-06-07: qty 20

## 2018-06-07 MED ORDER — ONDANSETRON HCL 4 MG/2ML IJ SOLN: 4.0000 mg | Freq: Four times a day (QID) | INTRAMUSCULAR | Status: DC | PRN

## 2018-06-07 MED ORDER — OXYTOCIN 10 UNIT/ML IJ SOLN
INTRAMUSCULAR | Status: AC
  Filled 2018-06-07: qty 4

## 2018-06-07 MED ORDER — MIDAZOLAM HCL 2 MG/2ML IJ SOLN
INTRAMUSCULAR | Status: DC | PRN
  Administered 2018-06-07: 2 mg via INTRAVENOUS

## 2018-06-07 MED ORDER — SILVER NITRATE-POT NITRATE 75-25 % EX MISC
CUTANEOUS | Status: DC | PRN
  Administered 2018-06-07: 2 via TOPICAL

## 2018-06-07 MED ORDER — ONDANSETRON HCL 4 MG/2ML IJ SOLN
4.0000 mg | Freq: Once | INTRAMUSCULAR | Status: AC
  Administered 2018-06-07: 4 mg via INTRAVENOUS

## 2018-06-07 MED ORDER — SUGAMMADEX SODIUM 200 MG/2ML IV SOLN
INTRAVENOUS | Status: DC | PRN
  Administered 2018-06-07: 120 mg via INTRAVENOUS

## 2018-06-07 MED ORDER — FENTANYL CITRATE (PF) 100 MCG/2ML IJ SOLN
INTRAMUSCULAR | Status: DC | PRN
  Administered 2018-06-07: 50 ug via INTRAVENOUS

## 2018-06-07 MED ORDER — SUCCINYLCHOLINE CHLORIDE 20 MG/ML IJ SOLN
INTRAMUSCULAR | Status: DC | PRN
  Administered 2018-06-07: 80 mg via INTRAVENOUS

## 2018-06-07 MED ORDER — PROMETHAZINE HCL 25 MG/ML IJ SOLN
INTRAMUSCULAR | Status: AC
  Administered 2018-06-07: 12.5 mg via INTRAVENOUS
  Filled 2018-06-07: qty 1

## 2018-06-07 MED ORDER — FENTANYL CITRATE (PF) 100 MCG/2ML IJ SOLN
INTRAMUSCULAR | Status: AC
  Filled 2018-06-07: qty 2

## 2018-06-07 MED ORDER — PROMETHAZINE HCL 25 MG/ML IJ SOLN
12.5000 mg | Freq: Four times a day (QID) | INTRAMUSCULAR | Status: AC | PRN
  Administered 2018-06-07: 12.5 mg via INTRAVENOUS

## 2018-06-07 MED ORDER — ONDANSETRON HCL 4 MG/2ML IJ SOLN
INTRAMUSCULAR | Status: AC
  Administered 2018-06-07: 4 mg via INTRAVENOUS
  Filled 2018-06-07: qty 2

## 2018-06-07 MED ORDER — SODIUM CHLORIDE 0.9 % IV SOLN: 500.0000 mL | Freq: Once | INTRAVENOUS | Status: DC

## 2018-06-07 MED ORDER — OXYTOCIN 10 UNIT/ML IJ SOLN
INTRAVENOUS | Status: DC | PRN
  Administered 2018-06-07: 40 [IU] via INTRAVENOUS

## 2018-06-07 MED ORDER — ONDANSETRON HCL 4 MG PO TABS: 4.0000 mg | ORAL_TABLET | Freq: Four times a day (QID) | ORAL | Status: DC | PRN

## 2018-06-07 MED ORDER — TRANEXAMIC ACID 1000 MG/10ML IV SOLN
INTRAVENOUS | Status: AC
  Filled 2018-06-07: qty 10

## 2018-06-07 MED ORDER — ALBUMIN HUMAN 5 % IV SOLN
INTRAVENOUS | Status: DC | PRN
  Administered 2018-06-07 (×2): via INTRAVENOUS

## 2018-06-07 MED ORDER — ALBUMIN HUMAN 5 % IV SOLN
INTRAVENOUS | Status: AC
  Filled 2018-06-07: qty 250

## 2018-06-07 MED ORDER — SODIUM CHLORIDE 0.9 % IV BOLUS
500.0000 mL | Freq: Once | INTRAVENOUS | Status: AC
  Administered 2018-06-07: 500 mL via INTRAVENOUS

## 2018-06-07 MED ORDER — METHYLERGONOVINE MALEATE 0.2 MG/ML IJ SOLN
INTRAMUSCULAR | Status: DC | PRN
  Administered 2018-06-07: 0.2 mg via INTRAMUSCULAR

## 2018-06-07 MED ORDER — AMMONIA AROMATIC IN INHA
RESPIRATORY_TRACT | Status: AC
  Filled 2018-06-07: qty 10

## 2018-06-07 MED ORDER — ONDANSETRON HCL 4 MG/2ML IJ SOLN
INTRAMUSCULAR | Status: DC | PRN
  Administered 2018-06-07: 4 mg via INTRAVENOUS

## 2018-06-07 MED ORDER — DOCUSATE SODIUM 100 MG PO CAPS
100.0000 mg | ORAL_CAPSULE | Freq: Two times a day (BID) | ORAL | Status: DC
  Administered 2018-06-07 – 2018-06-08 (×2): 100 mg via ORAL
  Filled 2018-06-07 (×2): qty 1

## 2018-06-07 MED ORDER — LACTATED RINGERS IV SOLN
INTRAVENOUS | Status: DC
  Administered 2018-06-07 – 2018-06-08 (×3): via INTRAVENOUS

## 2018-06-07 SURGICAL SUPPLY — 26 items
BAG URINE DRAINAGE (UROLOGICAL SUPPLIES) IMPLANT
CATH FOLEY 2WAY  5CC 16FR (CATHETERS)
CATH FOLEY 2WAY 5CC 16FR (CATHETERS)
CATH FOLEY 2WAY SIL 16X30 (CATHETERS) ×2 IMPLANT
CATH ROBINSON RED A/P 16FR (CATHETERS) ×3 IMPLANT
CATH URTH 16FR FL 2W BLN LF (CATHETERS) IMPLANT
FILTER UTR ASPR SPEC (MISCELLANEOUS) ×1 IMPLANT
FLTR UTR ASPR SPEC (MISCELLANEOUS) ×3
GLOVE BIO SURGEON STRL SZ7 (GLOVE) ×12 IMPLANT
GOWN STRL REUS W/ TWL LRG LVL3 (GOWN DISPOSABLE) ×2 IMPLANT
GOWN STRL REUS W/TWL LRG LVL3 (GOWN DISPOSABLE) ×6
KIT BERKELEY 1ST TRIMESTER 3/8 (MISCELLANEOUS) ×3 IMPLANT
KIT TURNOVER CYSTO (KITS) ×3 IMPLANT
NS IRRIG 500ML POUR BTL (IV SOLUTION) ×3 IMPLANT
PACK DNC HYST (MISCELLANEOUS) ×3 IMPLANT
PAD OB MATERNITY 4.3X12.25 (PERSONAL CARE ITEMS) ×3 IMPLANT
PAD PREP 24X41 OB/GYN DISP (PERSONAL CARE ITEMS) ×3 IMPLANT
SET BERKELEY SUCTION TUBING (SUCTIONS) ×3 IMPLANT
SPONGE XRAY 4X4 16PLY STRL (MISCELLANEOUS) ×2 IMPLANT
SUT VIC AB 3-0 SH 27 (SUTURE) ×3
SUT VIC AB 3-0 SH 27X BRD (SUTURE) ×1 IMPLANT
TOWEL OR 17X26 4PK STRL BLUE (TOWEL DISPOSABLE) ×3 IMPLANT
VACURETTE 10 RIGID CVD (CANNULA) ×3 IMPLANT
VACURETTE 12 RIGID CVD (CANNULA) ×3 IMPLANT
VACURETTE 8 RIGID CVD (CANNULA) ×1 IMPLANT
VACURETTE 8MM F TIP (MISCELLANEOUS) ×2 IMPLANT

## 2018-06-07 NOTE — Interval H&P Note (Signed)
History and Physical Interval Note:  06/07/2018 10:58 AM  Morgan Sullivan  has presented today for surgery, with the diagnosis of incomplete abortion  The various methods of treatment have been discussed with the patient and family. After consideration of risks, benefits and other options for treatment, the patient has consented to  Procedure(s): DILATATION AND EVACUATION (N/A) as a surgical intervention .  The patient's history has been reviewed, patient examined, no change in status, stable for surgery.  I have reviewed the patient's chart and labs.  Questions were answered to the patient's satisfaction.  Consents reviewed and patient wishes to proceed.  Prentice Docker, MD, [facility], [facility] 06/07/2018 10:59 AM

## 2018-06-07 NOTE — Op Note (Signed)
  Operative Note    Pre-Op Diagnosis: Missed abortion at [redacted] weeks gestation  Post-Op Diagnosis: Missed abortion at [redacted] weeks gestation  Procedures:  Suction, dilation and evacuation  Primary Surgeon: Prentice Docker, MD   EBL: 1,000 mL   IVF: 1,300 mL crystalloid, 500 mL colloid (albumin 5% ~ 25 mg)  Urine output: 50 mL  Specimens:  1) products of conception for microarray 2) products of conception for permanent pathology  Drains: non  Complications: None   Disposition: PACU   Condition: Stable   Findings: 12 week uterus  Procedure Summary:   After informed consent was confirmed, the patient was taken to the operating room where general anesthesia was induced.  Her legs were carefully placed in the [place].  She was prepped and draped in the standard fashion.  A speculum was placed in the vagina and the cervix was prepped with betadine.   A tenaculum was placed on the anterior lip of the cervix.  The cervix was serially dilated to 10 mm with the Hegar dilators.  The 10 mm suction curette was advanced to the uterine fundus.  There passes were made with the suction curette with evacuation of adequate tissue noted.  Two passes were made using sharp curettage until a gritty texture was noted.  Two additional passes were made with the suction curette.  The patient had significant bleeding at this point.  A foley catheter was threaded through the cervix and the balloon was instilled with 40 mL sterile saline.  She continued to have significant bleeding through the foley tubing.  She was given methergine 0.2 mg IM, pitocin 40 units in 1,000 mL saline. She was also given tranexemic acid 1,000 mg.  For resuscitation she was given Albumin 5% 500 mL for a total of 25 mg.  She had significantly decreased bleeding and eventual stoppage of bleeding.  She was monitored for a full 30 minutes with minimal bleeding through the foley tubing, which was flushed about every five minutes.  The  foley balloon was gradually emptied of saline over about 25 minutes with continued minimal bleeding.  The foley catheter was removed and she continued to have minimal bleeding. Of note, the tenaculum did come off the cervix causing a laceration. However upon removal of the tenaculum the tenaculum sites were made hemostatic with silver nitrate.  The laceration was hemostatic.  So, no repair was performed.   All instruments were removed from the vagina and the cervix was noted to be hemostatic after removal of the tenaculum after application of silver nitrate, as above.   The patient tolerated the procedure well.  Sponge, lap, needle, and instrument counts were correct x 2.  VTE prophylaxis: SCDs. Antibiotic prophylaxis: doxycycline 200 mg IV prior to the start of the case (within 60 minutes of start). She was awakened in the operating room and was taken to the PACU in stable condition.   Prentice Docker, MD 06/07/2018 1:12 PM

## 2018-06-07 NOTE — Anesthesia Post-op Follow-up Note (Signed)
Anesthesia QCDR form completed.        

## 2018-06-07 NOTE — Anesthesia Procedure Notes (Addendum)
Procedure Name: Intubation Date/Time: 06/07/2018 11:13 AM Performed by: Doreen Salvage, CRNA Pre-anesthesia Checklist: Patient identified, Emergency Drugs available, Suction available and Patient being monitored Patient Re-evaluated:Patient Re-evaluated prior to induction Oxygen Delivery Method: Circle system utilized Preoxygenation: Pre-oxygenation with 100% oxygen Induction Type: IV induction, Cricoid Pressure applied and Rapid sequence Ventilation: Mask ventilation without difficulty Laryngoscope Size: Mac, 3 and McGraph Grade View: Grade II Tube type: Oral Tube size: 7.0 mm Number of attempts: 1 Airway Equipment and Method: Stylet Placement Confirmation: ETT inserted through vocal cords under direct vision,  positive ETCO2 and breath sounds checked- equal and bilateral Secured at: 21 cm Tube secured with: Tape Dental Injury: Teeth and Oropharynx as per pre-operative assessment

## 2018-06-07 NOTE — Transfer of Care (Signed)
Immediate Anesthesia Transfer of Care Note  Patient: Morgan Sullivan  Procedure(s) Performed: Procedure(s): DILATATION AND EVACUATION (N/A)  Patient Location: PACU  Anesthesia Type:General  Level of Consciousness: sedated  Airway & Oxygen Therapy: Patient Spontanous Breathing and Patient connected to face mask oxygen  Post-op Assessment: Report given to RN and Post -op Vital signs reviewed and stable  Post vital signs: Reviewed and stable  Last Vitals:  Vitals:   06/07/18 0855 06/07/18 1305  BP: 115/79 121/67  Pulse: 63 (!) 112  Resp: 17 15  Temp: 37.1 C   SpO2: 53% 005%    Complications: No apparent anesthesia complications

## 2018-06-07 NOTE — Progress Notes (Signed)
Post operative Progress note  Subjective: patient feels well. She has tolerated PO. Minimal discomfort and bleeding.   RN reported an earlier syncopal episode while patient voiding in bathroom. Her vitals were stable and normal at the time.  She quickly aroused and recovered. No further episodes since that time.   Objective: BP 106/65   Pulse 86   Temp 99.2 F (37.3 C) (Oral)   Resp 18   Ht 5\' 2"  (1.575 m)   Wt 62.6 kg   LMP  (LMP Unknown)   SpO2 100%   Breastfeeding? Unknown   BMI 25.24 kg/m   Gen: NAD CV: RRR, SEM II/VI Lungs: CTAB Abd: +BS, soft, non-tender Ext: SCDs  A/P: POD# 0 s/p suction, D&E for missed AB at 12 weeks. Significant hemorrhage. Doing well now. - CBC at 2000  - continue methergine 0.2 mg Q 8 hours - plan for discharge in AM.  Prentice Docker, MD, [place], [facility] 06/07/2018 6:53 PM

## 2018-06-07 NOTE — Progress Notes (Signed)
    Prenatal Care Visit  Subjective  Morgan Sullivan is a 37 y.o. [id number] at [redacted]w[redacted]d being seen today for prenatal care.  She is currently monitored for the following issues for this high-risk pregnancy and has Depression affecting pregnancy; Anxiety disorder affecting pregnancy, antepartum; Supervision of high-risk pregnancy; Supervision of elderly multigravida in first trimester; [redacted] weeks gestation of pregnancy; Nausea and vomiting during pregnancy; Abnormal genetic test during pregnancy; and Infertility, female on their problem list.  ----------------------------------------------------------------------------------- Patient reports no complaints.  She has not had any cramping or vaginal bleeding.  ----------------------------------------------------------------------------------- The following portions of the patient's history were reviewed and updated as appropriate: allergies, current medications, past family history, past medical history, past social history, past surgical history and problem list. Problem list updated.   Objective  Blood pressure 110/64, weight 137 lb (62.1 kg), last menstrual period 03/11/2018. Pregravid weight 134 lb (60.8 kg) Total Weight Gain 3 lb (1.361 kg) Urinalysis: Protein Negative, Glucose Negative  Fetal Status: FHT not heard via Doppler or visualized on BSUS. .  General:  Alert, oriented and cooperative. Patient is in no acute distress.  Skin: Skin is warm and dry. No rash noted.   Cardiovascular: Normal heart rate noted  Respiratory: Normal respiratory effort, no problems with respiration noted  Abdomen: Soft, appropriate for gestational age.       Pelvic:  Cervical exam deferred        Extremities: Normal range of motion.     Mental Status: Normal mood and affect. Normal behavior. Normal judgment and thought content.     Assessment   37 y.o. [id number] at [redacted]w[redacted]d EDD 12/16/2018, by Last Menstrual Period presenting for a prenatal visit.  Plan    Ultrasound confirmed absence of fetal cardiac activity.   ULTRASOUND REPORT  Location: [facility] Date of Service: 06/03/2018   Patient Name: Morgan Sullivan DOB: Apr 08, 1981 MRN: [id number]  Indications:dating Findings:  Nelda [place] intrauterine pregnancy is visualized with a CRL consistent with [redacted]w[redacted]d gestation, giving an (U/S) EDD of 12/17/2018. The (U/S) EDD is consistent with the clinically established EDD of 12/16/2018.  FHR: ABSENT CRL measurement: 51.7 mm Yolk sac is not visualized and early anatomy is normal. Amnion: not visualized   Survey of the adnexa demonstrates no adnexal masses. There is no free peritoneal fluid in the cul de sac.  Impression: 1. [redacted]w[redacted]d NON Viable Singleton Intrauterine pregnancy by U/S*. 2. (U/S) EDD is consistent with Clinically established EDD of 12/16/2018.  [place] were offered to the patient and her family.  I stressed  that this did not occur because of actions or inactions by the patient.     We briefly discussed management options including expectant management, medical management, and surgical management as well as their relative success rates and complications.   She previously had a complicated miscarriage at around this same gestational age. At that time, after failure of expectant management, she opted for medical management but retained her placenta and had to undergo a D&C. Thus, she would prefer to schedule a surgery at the outset this time.   Surgery was booked today for next Tuesday with Dr. Glennon Mac, and she will complete her pre-operative appointment that morning. She received written instructions about the procedure and verbalized understanding.    The patient is Rh positive and Rhogam is therefore not indicated.    Avel Sensor, CNM

## 2018-06-07 NOTE — Progress Notes (Signed)
Dr. Glennon Mac notified at 1610 of patient passing out in bathroom without injury.   Pt felt a little dizzy/weak but VS were taken (119/59, HR 85) prior to getting out of bed and walking to bathroom with assistance from RN and pt's husband. Pt voided and stated after a couple minutes "I don't feel good" while leaning on husband. Pt passed out shortly after while sitting on toilet. Ammonia was brought and pt transferred onto wheelchair to get back in bed where she was placed in trendelenburg position and 500 ml LR bolus infused per Dr. Glennon Mac.   Patient is feeling better and eating dinner. VSS. Dr. Glennon Mac up to see patient. CBC to be drawn at 2000.

## 2018-06-08 ENCOUNTER — Encounter: Payer: Self-pay | Admitting: Obstetrics and Gynecology

## 2018-06-08 DIAGNOSIS — O021 Missed abortion: Secondary | ICD-10-CM | POA: Diagnosis not present

## 2018-06-08 DIAGNOSIS — N939 Abnormal uterine and vaginal bleeding, unspecified: Secondary | ICD-10-CM | POA: Diagnosis not present

## 2018-06-08 MED ORDER — METHYLERGONOVINE MALEATE 0.2 MG PO TABS: 0.2000 mg | ORAL_TABLET | Freq: Three times a day (TID) | ORAL | 0 refills | Status: AC

## 2018-06-08 MED ORDER — IBUPROFEN 600 MG PO TABS: 600.0000 mg | ORAL_TABLET | Freq: Four times a day (QID) | ORAL | 0 refills | Status: DC | PRN

## 2018-06-08 MED ORDER — METHYLERGONOVINE MALEATE 0.2 MG PO TABS
0.2000 mg | ORAL_TABLET | Freq: Three times a day (TID) | ORAL | Status: DC
  Filled 2018-06-08 (×2): qty 1

## 2018-06-08 NOTE — Progress Notes (Signed)
Pt wheeled out by staff with husband.

## 2018-06-08 NOTE — Anesthesia Preprocedure Evaluation (Signed)
Anesthesia Evaluation  Patient identified by MRN, date of birth, ID band Patient awake    Reviewed: Allergy & Precautions, H&P , NPO status , Patient's Chart, lab work & pertinent test results  History of Anesthesia Complications Negative for: history of anesthetic complications  Airway Mallampati: II  TM Distance: >3 FB Neck ROM: full    Dental  (+) Chipped   Pulmonary neg shortness of breath, former smoker,           Cardiovascular Exercise Tolerance: Good (-) angina(-) Past MI negative cardio ROS       Neuro/Psych PSYCHIATRIC DISORDERS negative neurological ROS     GI/Hepatic negative GI ROS, Neg liver ROS,   Endo/Other  negative endocrine ROS  Renal/GU      Musculoskeletal   Abdominal   Peds  Hematology negative hematology ROS (+)   Anesthesia Other Findings Past Medical History: No date: Depression  Past Surgical History: 06/07/2018: DILATION AND EVACUATION; N/A     Comment:  Procedure: DILATATION AND EVACUATION;  Surgeon: Will Bonnet, MD;  Location: [facility] ORS;  Service: Gynecology;              Laterality: N/A; No date: NO PAST SURGERIES  BMI    Body Mass Index:  25.24 kg/m      Reproductive/Obstetrics negative OB ROS                             Anesthesia Physical Anesthesia Plan  ASA: II  Anesthesia Plan: General ETT   Post-op Pain Management:    Induction: Intravenous  PONV Risk Score and Plan: Ondansetron, Dexamethasone, Midazolam and Treatment may vary due to age or medical condition  Airway Management Planned: Oral ETT  Additional Equipment:   Intra-op Plan:   Post-operative Plan: Extubation in OR  Informed Consent: I have reviewed the patients History and Physical, chart, labs and discussed the procedure including the risks, benefits and alternatives for the proposed anesthesia with the patient or authorized representative who has  indicated his/her understanding and acceptance.   Dental Advisory Given  Plan Discussed with: Anesthesiologist, CRNA and Surgeon  Anesthesia Plan Comments: (Patient consented for risks of anesthesia including but not limited to:  - adverse reactions to medications - damage to teeth, lips or other oral mucosa - sore throat or hoarseness - Damage to heart, brain, lungs or loss of life  Patient voiced understanding.)        Anesthesia Quick Evaluation

## 2018-06-08 NOTE — Progress Notes (Signed)
Per pt and her husband; they were calling around to different pharmacies for a specific medication that the doctor said was needed after discharged from hospital; pt's husband said that [facility] on [address] can get this medication on Thursday afternoon around 1600

## 2018-06-08 NOTE — Anesthesia Postprocedure Evaluation (Signed)
Anesthesia Post Note  Patient: Morgan Sullivan  Procedure(s) Performed: DILATATION AND EVACUATION (N/A )  Patient location during evaluation: PACU Anesthesia Type: General Level of consciousness: awake and alert Pain management: pain level controlled Vital Signs Assessment: post-procedure vital signs reviewed and stable Respiratory status: spontaneous breathing, nonlabored ventilation, respiratory function stable and patient connected to nasal cannula oxygen Cardiovascular status: blood pressure returned to baseline and stable Postop Assessment: no apparent nausea or vomiting Anesthetic complications: no     Last Vitals:  Vitals:   06/08/18 0324 06/08/18 0734  BP: (!) 108/51 101/61  Pulse: 75 78  Resp: 14 18  Temp: 36.7 C 36.9 C  SpO2: 100% 100%    Last Pain:  Vitals:   06/08/18 1214  TempSrc:   PainSc: 0-No pain                 Precious Haws Annaleese Guier

## 2018-06-08 NOTE — Progress Notes (Signed)
Social work notified of patient need for assistance to get medication needed at discharge (Methergine).

## 2018-06-08 NOTE — Care Management (Signed)
Patient discharged home today with husband.  Patient discharged with Methergine.  [facility], and [facility] do not have this medication in stock.    Patient's routine pharmacy is [facility].  They will order the medication and it will be available for pick up tomorrow after 12 pm.  Patient to be provided with 6 methergine tablets from inpatient pharmacy prior to discharge, and pick up remaining tablets from [facility] tomorrow.  Patient updated with this information.

## 2018-06-08 NOTE — Progress Notes (Signed)
Patient is stable and ready for discharge today. VSS, bleeding scant, walked in room and hallway today- pt states "I feel weak but much better!" Patient will be discharged home with husband. Discharge instructions and prescriptions given and reviewed with patient. Follow up appointment reviewed with patient and to call with any questions/concerns if needed before appt. Patient verbalized understanding of all instructions. Will be escorted out by auxillary.

## 2018-06-08 NOTE — Plan of Care (Signed)
VS improved this shift; up to bedside commode now with no dizziness/lightheadedness; light to scant blood on peri pad now; tolerating regular diet; taking motrin for pain control

## 2018-06-08 NOTE — Discharge Summary (Signed)
DC Summary Discharge Summary   Patient ID: Morgan Sullivan [id number] 37 y.o. 12/01/1980  Admit date: 06/07/2018  Discharge date: 06/08/2018  Principal Diagnoses:  1) missed abortion at [redacted] weeks gestation 2) hemorrhage due to surgical procedure  Secondary Diagnoses:  none  Procedures performed during the hospitalization:  1) Suction, dilation and evacuation on 06/07/18 2) postoperative care and monitoring post-operatively  HPI: 37 y.o. [id number] female with recently diagnosed missed abortion at [redacted] weeks gestation.  She presented to the hospital for suction, dilation and evacuation.   Past Medical History:  Diagnosis Date  . Depression     Past Surgical History:  Procedure Laterality Date  . NO PAST SURGERIES      Allergies  Allergen Reactions  . Oxycodone-Acetaminophen Nausea And Vomiting    Social History   Tobacco Use  . Smoking status: Former Research scientist (life sciences)  . Smokeless tobacco: Never Used  Substance Use Topics  . Alcohol use: Yes    Alcohol/week: 1.0 standard drinks    Types: 1 Glasses of wine per week    Comment: not during pregnancy  . Drug use: No    Family History  Problem Relation Age of Onset  . Skin cancer Mother 57  . Alzheimer's disease Maternal Grandmother   . Prostate cancer Maternal Grandfather   . Colon cancer Paternal [facility] Course:  She underwent the procedure which was significant for EBL of ~1 liter.  She was observed postoperatively for continued bleeding and possible need for blood transfusion. She was able to tolerate PO. Her bleeding was minimal.  Her blood counts did drop from a hemoglobin of 13 to 9.  The first time she went to the bathroom to void, she passed out.  She had no further episodes.  She was able to ambulate and her pain was well controlled. She has stable and normal vital signs. She was, therefore, deemed to be an appropriate candidate for discharge.   Discharge Exam: BP 101/61 (BP Location: Left Arm)   Pulse  78   Temp 98.4 F (36.9 C) (Oral)   Resp 18   Ht 5\' 2"  (1.575 m)   Wt 62.6 kg   LMP  (LMP Unknown)   SpO2 100%   Breastfeeding? Unknown   BMI 25.24 kg/m  General  no apparent distress   CV  RRR, SEM II/VI   Pulmonary  clear to ausculatation bllaterally   Abdomen  +BS, NT/ND   Extremities  no edema, symmetric, SCDs in place    Condition at Discharge: Stable  Complications affecting treatment: None  Discharge Medications:  Allergies as of 06/08/2018      Reactions   Oxycodone-acetaminophen Nausea And Vomiting      Medication List    TAKE these medications   ibuprofen 600 MG tablet Commonly known as:  ADVIL,MOTRIN Take 1 tablet (600 mg total) by mouth every 6 (six) hours as needed (mild pain).   methylergonovine 0.2 MG tablet Commonly known as:  METHERGINE Take 1 tablet (0.2 mg total) by mouth 3 (three) times daily for 4 days.   sertraline 100 MG tablet Commonly known as:  ZOLOFT Take 1 tablet (100 mg total) by mouth daily.      Follow-up arrangements:  Follow-up Information    Will Bonnet, MD. Schedule an appointment as soon as possible for a visit in 2 week(s).   Specialty:  Obstetrics and Gynecology Why:  Post op follow up Contact information: [address] [place] [address] [phone]  Discharge Disposition: home to self-care   Signed: Prentice Docker, MD 06/08/2018 9:09 AM

## 2018-06-09 LAB — TYPE AND SCREEN
ABO/RH(D): O POS
Antibody Screen: NEGATIVE
Unit division: 0

## 2018-06-09 LAB — BPAM RBC
BLOOD PRODUCT EXPIRATION DATE: 201910072359
ISSUE DATE / TIME: 201909171215
Unit Type and Rh: 5100

## 2018-06-09 LAB — PREPARE RBC (CROSSMATCH)

## 2018-06-10 LAB — SURGICAL PATHOLOGY

## 2018-06-16 LAB — MISC LABCORP TEST (SEND OUT): LABCORP TEST CODE: 511402

## 2018-06-22 ENCOUNTER — Encounter: Payer: Self-pay | Admitting: Obstetrics and Gynecology

## 2018-06-22 ENCOUNTER — Ambulatory Visit (INDEPENDENT_AMBULATORY_CARE_PROVIDER_SITE_OTHER): Payer: 59 | Admitting: Obstetrics and Gynecology

## 2018-06-22 VITALS — BP 120/80 | Ht 62.0 in | Wt 137.0 lb

## 2018-06-22 DIAGNOSIS — Z09 Encounter for follow-up examination after completed treatment for conditions other than malignant neoplasm: Secondary | ICD-10-CM

## 2018-06-22 DIAGNOSIS — Z23 Encounter for immunization: Secondary | ICD-10-CM | POA: Diagnosis not present

## 2018-06-22 DIAGNOSIS — Z9889 Other specified postprocedural states: Secondary | ICD-10-CM

## 2018-06-22 DIAGNOSIS — N96 Recurrent pregnancy loss: Secondary | ICD-10-CM | POA: Diagnosis not present

## 2018-06-22 NOTE — Progress Notes (Signed)
   Postoperative Follow-up Patient presents post op from suction, dilation and curettage 2 weeks ago for 12 week missed spontaneous abortion.  Subjective: Patient reports marked improvement in her preop symptoms. Eating a regular diet without difficulty. The patient is not having any pain.  Activity: normal activities of daily living.  Objective: Vitals:   06/22/18 0951  BP: 120/80   Vital Signs: BP 120/80   Ht 5\' 2"  (1.575 m)   Wt 137 lb (62.1 kg)   BMI 25.06 kg/m  Constitutional: Well nourished, well developed female in no acute distress.  HEENT: normal Skin: Warm and dry.  Extremity: no edema  Abdomen: Soft, non-tender, normal bowel sounds; no bruits, organomegaly or masses.    Assessment: 37 y.o. s/p suction, dilation and curettage progressing well  Plan: Patient has done well after surgery with no apparent complications.  I have discussed the post-operative course to date, and the expected progress moving forward.  The patient understands what complications to be concerned about.  I will see the patient in routine follow up, or sooner if needed.    Activity plan: No restriction.  Discussed lab findings with patient. She still has two frozen embryos with [facility].  Will get APLAS testing today. She understand that if abnormal, labs would need to be repeated in 12+ weeks to make definitive diagnosis.   Prentice Docker, MD 06/22/2018, 10:05 AM

## 2018-06-25 LAB — BETA-2-GLYCOPROTEIN I ABS, IGG/M/A: Beta-2 Glyco 1 IgM: <9 GPI IgM units (ref 0–32)

## 2018-06-25 LAB — LUPUS ANTICOAGULANT
Dilute Viper Venom Time: 29.8 s (ref 0.0–47.0)
PTT Lupus Anticoagulant: 37.5 s (ref 0.0–51.9)
THROMBIN TIME: 18.1 s (ref 0.0–23.0)
dPT Confirm Ratio: 0.92 Ratio (ref 0.00–1.40)
dPT: 35.2 s (ref 0.0–55.0)

## 2018-06-25 LAB — CARDIOLIPIN ANTIBODIES, IGG, IGM, IGA
Anticardiolipin IgA: <9 APL U/mL (ref 0–11)
Anticardiolipin IgM: 9 MPL U/mL (ref 0–12)

## 2018-07-09 ENCOUNTER — Emergency Department (HOSPITAL_COMMUNITY)
Admission: EM | Admit: 2018-07-09 | Discharge: 2018-07-09 | Disposition: A | Discharge status: Home / Self Care | Payer: 59 | Attending: Emergency Medicine | Admitting: Emergency Medicine

## 2018-07-09 ENCOUNTER — Encounter (HOSPITAL_COMMUNITY): Payer: Self-pay | Admitting: Emergency Medicine

## 2018-07-09 DIAGNOSIS — S61230A Puncture wound without foreign body of right index finger without damage to nail, initial encounter: Secondary | ICD-10-CM | POA: Diagnosis not present

## 2018-07-09 DIAGNOSIS — Z7721 Contact with and (suspected) exposure to potentially hazardous body fluids: Secondary | ICD-10-CM | POA: Diagnosis not present

## 2018-07-09 DIAGNOSIS — W461XXA Contact with contaminated hypodermic needle, initial encounter: Secondary | ICD-10-CM

## 2018-07-09 DIAGNOSIS — Z79899 Other long term (current) drug therapy: Secondary | ICD-10-CM | POA: Insufficient documentation

## 2018-07-09 DIAGNOSIS — Z87891 Personal history of nicotine dependence: Secondary | ICD-10-CM | POA: Diagnosis not present

## 2018-07-09 LAB — RAPID HIV SCREEN (HIV 1/2 AB+AG)
HIV 1/2 ANTIBODIES: NONREACTIVE
HIV-1 P24 Antigen - HIV24: NONREACTIVE

## 2018-07-09 LAB — POC URINE PREG, ED: Preg Test, Ur: NEGATIVE

## 2018-07-09 MED ORDER — ELVITEG-COBIC-EMTRICIT-TENOFAF 150-150-200-10 MG PREPACK
5.0000 | ORAL_TABLET | Freq: Once | ORAL | Status: DC
  Filled 2018-07-09: qty 5

## 2018-07-09 MED ORDER — ELVITEG-COBIC-EMTRICIT-TENOFAF 150-150-200-10 MG PO TABS: 1.0000 | ORAL_TABLET | Freq: Every day | ORAL | 0 refills | Status: DC

## 2018-07-09 NOTE — Discharge Instructions (Addendum)
Please follow-up as soon as possible with your employer regarding when you will find out the results of the hepatitis panel.  The results of this will help guide the urgency of revaccination.  Revaccination does not initiate until we know the status of the source patient.  Thank you for allowing [place] to participate in your care today!

## 2018-07-09 NOTE — ED Provider Notes (Signed)
[facility] EMERGENCY DEPARTMENT Provider Note   CSN: [id number] Arrival date & time: 07/09/18  1246     History   Chief Complaint Chief Complaint  Patient presents with  . Body Fluid Exposure    HPI Morgan Sullivan is a 37 y.o. female.  HPI  Patient is a 37 year old female with a history of depression presenting for needlestick exposure.  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  Source patient is not known to have HIV, hepatitis C, or hepatitis B per patient.  Patient states that she wiped the needlestick off with alcohol, and proceeded to wash the finger for 1 minute.  Minimal bleeding occurred, but patient reports that she continued to help it bleed.  Patient reports that in the past she has been a nonresponder to her childhood hepatitis B vaccinations, and has not yet completed the revaccination course.  Tetanus shot is up-to-date.  Past Medical History:  Diagnosis Date  . Depression     Patient Active Problem List   Diagnosis Date Noted  . Recurrent pregnancy loss 06/22/2018  . Hemorrhage of uterus 06/08/2018  . Missed abortion 06/07/2018  . Family history of non-recurrent fetal loss 06/07/2018  . Infertility, female 12/18/2017  . Abnormal genetic test during pregnancy 06/25/2017  . Nausea and vomiting during pregnancy 06/11/2017  . Supervision of high-risk pregnancy 06/02/2017  . Supervision of elderly multigravida in first trimester 06/02/2017  . [redacted] weeks gestation of pregnancy 06/02/2017  . Depression affecting pregnancy 09/12/2015  . Anxiety disorder affecting pregnancy, antepartum 01/12/2013    Past Surgical History:  Procedure Laterality Date  . DILATION AND EVACUATION N/A 06/07/2018   Procedure: DILATATION AND EVACUATION;  Surgeon: Will Bonnet, MD;  Location: [facility] ORS;  Service: Gynecology;  Laterality: N/A;  . NO PAST SURGERIES       OB History    Gravida  5   Para  2   Term   2   Preterm      AB  2   Living  2     SAB  2   TAB      Ectopic      Multiple      Live Births  2            Home Medications    Prior to Admission medications   Medication Sig Start Date End Date Taking? Authorizing Provider  ibuprofen (ADVIL,MOTRIN) 600 MG tablet Take 1 tablet (600 mg total) by mouth every 6 (six) hours as needed (mild pain). 06/08/18   Will Bonnet, MD  sertraline (ZOLOFT) 100 MG tablet Take 1 tablet (100 mg total) by mouth daily. 04/06/18   Malachy Mood, MD    Family History Family History  Problem Relation Age of Onset  . Skin cancer Mother 59  . Alzheimer's disease Maternal Grandmother   . Prostate cancer Maternal Grandfather   . Colon cancer Paternal Grandfather     Social History Social History   Tobacco Use  . Smoking status: Former Research scientist (life sciences)  . Smokeless tobacco: Never Used  Substance Use Topics  . Alcohol use: Yes    Alcohol/week: 1.0 standard drinks    Types: 1 Glasses of wine per week    Comment: not during pregnancy  . Drug use: No     Allergies   Oxycodone-acetaminophen   Review of Systems Review of Systems  Skin: Positive for wound. Negative for color change.  Neurological:  Negative for weakness and numbness.     Physical Exam Updated Vital Signs BP 135/60 (BP Location: Right Arm)   Pulse 82   Temp 98.7 F (37.1 C) (Oral)   Resp 20   LMP 06/06/2018   SpO2 100%   Physical Exam  Constitutional: She appears well-developed and well-nourished. No distress.  Sitting comfortably in bed.  HENT:  Head: Normocephalic and atraumatic.  Eyes: Conjunctivae are normal. Right eye exhibits no discharge. Left eye exhibits no discharge.  EOMs normal to gross examination.  Neck: Normal range of motion.  Cardiovascular: Normal rate and regular rhythm.  Intact, 2+ radial pulse.  Pulmonary/Chest:  Normal respiratory effort. Patient converses comfortably. No audible wheeze or stridor.  Abdominal: She exhibits  no distension.  Musculoskeletal: Normal range of motion.  Neurological: She is alert.  Cranial nerves intact to gross observation. Patient moves extremities without difficulty.  Skin: Skin is warm and dry. She is not diaphoretic.  Punctate needlestick exposure noted to the right index finger.  Psychiatric: She has a normal mood and affect. Her behavior is normal. Judgment and thought content normal.  Nursing note and vitals reviewed.    ED Treatments / Results  Labs (all labs ordered are listed, but only abnormal results are displayed) Labs Reviewed  RAPID HIV SCREEN (HIV 1/2 AB+AG)  HEPATITIS PANEL, ACUTE  POC URINE PREG, ED    EKG None  Radiology No results found.  Procedures Procedures (including critical care time)  Medications Ordered in ED Medications - No data to display   Initial Impression / Assessment and Plan / ED Course  I have reviewed the triage vital signs and the nursing notes.  Pertinent labs & imaging results that were available during my care of the patient were reviewed by me and considered in my medical decision making (see chart for details).  Clinical Course as of Jul 09 1721  Sat Jul 09, 2018  1706 Spoke with Dr. Linus Salmons of infectious disease who recommends starting patient on PEP.  Also states that revaccination of patient for hepatitis B would not begin until after source patient is tested.  I appreciate his involvement in the care of this patient.   [AM]  1719 Received information from patient's manager that source patient is negative for HIV 1 and 2.  PEP canceled.   [AM]  1721 Verified with patient that she has employee health to follow-up with.   [AM]    Clinical Course User Index [AM] Albesa Seen, PA-C    Patient is well-appearing in no acute distress.  Patient with puncture wound to the right index finger from dirty needle with presumed dry blood on it.  Patient was able to obtain information that rapid HIV for source patient is  negative.  Acute hepatitis panel for both patient and source patient are still pending.  Patient is to follow-up with her employee health.  Patient is in understanding and agrees with the plan of care.  Final Clinical Impressions(s) / ED Diagnoses   Final diagnoses:  Needlestick injury accident with exposure to body fluid    ED Discharge Orders    None       Albesa Seen, PA-C 07/09/18 1724    Lennice Sites, DO 07/09/18 1926

## 2018-07-09 NOTE — ED Notes (Signed)
Pt stable and ambulatory for discharge, states understanding follow up.  

## 2018-07-09 NOTE — ED Triage Notes (Signed)
Pt presents to ED for assessment after sticking her right pointer finger with a possibly dirty needle at a clients house today.

## 2018-07-10 LAB — HEPATITIS PANEL, ACUTE
HCV Ab: <0.1 s/co ratio (ref 0.0–0.9)
HEP A IGM: NEGATIVE
HEP B C IGM: NEGATIVE
Hepatitis B Surface Ag: NEGATIVE

## 2018-07-20 DIAGNOSIS — Z23 Encounter for immunization: Secondary | ICD-10-CM | POA: Diagnosis not present

## 2018-07-20 DIAGNOSIS — N96 Recurrent pregnancy loss: Secondary | ICD-10-CM | POA: Diagnosis not present

## 2018-07-27 DIAGNOSIS — R05 Cough: Secondary | ICD-10-CM | POA: Diagnosis not present

## 2018-07-27 DIAGNOSIS — J069 Acute upper respiratory infection, unspecified: Secondary | ICD-10-CM | POA: Diagnosis not present

## 2018-08-29 LAB — POC/TISSUE MICROARRAY

## 2018-09-26 DIAGNOSIS — Z319 Encounter for procreative management, unspecified: Secondary | ICD-10-CM | POA: Diagnosis not present

## 2018-10-03 DIAGNOSIS — N96 Recurrent pregnancy loss: Secondary | ICD-10-CM | POA: Diagnosis not present

## 2018-10-05 DIAGNOSIS — N8500 Endometrial hyperplasia, unspecified: Secondary | ICD-10-CM | POA: Diagnosis not present

## 2018-10-05 DIAGNOSIS — Z319 Encounter for procreative management, unspecified: Secondary | ICD-10-CM | POA: Diagnosis not present

## 2018-10-05 DIAGNOSIS — Z3141 Encounter for fertility testing: Secondary | ICD-10-CM | POA: Diagnosis not present

## 2018-10-05 DIAGNOSIS — Z3201 Encounter for pregnancy test, result positive: Secondary | ICD-10-CM | POA: Diagnosis not present

## 2018-10-05 DIAGNOSIS — N96 Recurrent pregnancy loss: Secondary | ICD-10-CM | POA: Diagnosis not present

## 2018-11-02 DIAGNOSIS — J029 Acute pharyngitis, unspecified: Secondary | ICD-10-CM | POA: Diagnosis not present

## 2018-11-02 DIAGNOSIS — J028 Acute pharyngitis due to other specified organisms: Secondary | ICD-10-CM | POA: Diagnosis not present

## 2018-11-02 DIAGNOSIS — B9789 Other viral agents as the cause of diseases classified elsewhere: Secondary | ICD-10-CM | POA: Diagnosis not present

## 2018-11-08 DIAGNOSIS — Z3200 Encounter for pregnancy test, result unknown: Secondary | ICD-10-CM | POA: Diagnosis not present

## 2018-11-10 DIAGNOSIS — Z3201 Encounter for pregnancy test, result positive: Secondary | ICD-10-CM | POA: Diagnosis not present

## 2018-11-23 DIAGNOSIS — Z3200 Encounter for pregnancy test, result unknown: Secondary | ICD-10-CM | POA: Diagnosis not present

## 2018-12-07 DIAGNOSIS — O0900 Supervision of pregnancy with history of infertility, unspecified trimester: Secondary | ICD-10-CM | POA: Diagnosis not present

## 2018-12-14 ENCOUNTER — Telehealth: Payer: Self-pay | Admitting: Obstetrics and Gynecology

## 2018-12-14 NOTE — Telephone Encounter (Signed)
Per SDJ schedule appointment for 01/15/19. Patient is schedule at 1:30

## 2018-12-14 NOTE — Telephone Encounter (Signed)
-----   Message from Will Bonnet, MD sent at 12/14/2018 12:45 PM EDT ----- Regarding: RE: appointment 3/26 See my new note about her.  Let's just leave the appointment as-is.  I'll do a bedside ultrasound. If we need to cut the visit short due to my responsibilities at the hospital, I'll just do an ultrasound and reschedule her NOB.  Let me know, if you have questions... SDJ ----- Message ----- From: Audree Camel Sent: 12/14/2018  11:47 AM EDT To: Will Bonnet, MD Subject: RE: appointment 3/26                           Patient is schedule for Thursday 12/15/18 at 1:30.  ----- Message ----- From: Will Bonnet, MD Sent: 12/14/2018  10:45 AM EDT To: Audree Camel Subject: appointment 3/26                               Shona Simpson, Would you mind creating a "Follow up" appointment for this patient for me for tomorrow afternoon? She will need to be called.  I know I'm not in clinic. But, I will come over from the hospital and do the appointment quickly.  I will probably just do it on my hall since it will not be very busy.  Thank you!  Let me know, if you have any questions.  Prentice Docker, MD

## 2018-12-14 NOTE — Telephone Encounter (Signed)
Appointment schedule / per Phone call per [facility]

## 2018-12-14 NOTE — Telephone Encounter (Signed)
Has 10 wk u/s appt/NOB 12/22/18 with Dr. Glennon Mac, hx of multiple miscarriages. Hasn't had u/s this wk. Wants to know if she could come in for u/s this wk due to anxiety. Just wanted to ask.

## 2018-12-14 NOTE — Telephone Encounter (Signed)
So given the COVID-19 and [place] limiting appointment I'm not currently offering the closer follow up in the first trimester as we are limiting visits

## 2018-12-15 ENCOUNTER — Encounter: Payer: Self-pay | Admitting: Obstetrics and Gynecology

## 2018-12-15 ENCOUNTER — Other Ambulatory Visit: Payer: Self-pay

## 2018-12-15 ENCOUNTER — Ambulatory Visit (INDEPENDENT_AMBULATORY_CARE_PROVIDER_SITE_OTHER): Payer: 59 | Admitting: Obstetrics and Gynecology

## 2018-12-15 VITALS — BP 122/74 | Wt 144.0 lb

## 2018-12-15 DIAGNOSIS — N96 Recurrent pregnancy loss: Secondary | ICD-10-CM | POA: Diagnosis not present

## 2018-12-15 NOTE — Progress Notes (Signed)
Patient presents for bedside ultrasound for re-assurance given history of recurrent pregnancy loss. She is a [id number] at [redacted]w[redacted]d by embryo transfer dating with EDD of 07/19/2019.  Bedside u/s shows single living intrauterine pregnancy with CRL consistent with [redacted]w[redacted]d gestation. Embryonic cardiac activity with rate of 180 bpm.  No issues today.  Follow up in 1 week for NOB.  Prentice Docker, MD, [facility], [facility] 12/15/2018 1:53 PM

## 2018-12-22 ENCOUNTER — Encounter: Payer: 59 | Admitting: Obstetrics and Gynecology

## 2018-12-22 ENCOUNTER — Ambulatory Visit (INDEPENDENT_AMBULATORY_CARE_PROVIDER_SITE_OTHER): Payer: 59 | Admitting: Obstetrics and Gynecology

## 2018-12-22 ENCOUNTER — Other Ambulatory Visit (HOSPITAL_COMMUNITY)
Admission: RE | Admit: 2018-12-22 | Discharge: 2018-12-22 | Disposition: A | Discharge status: Home / Self Care | Payer: 59 | Source: Ambulatory Visit | Attending: Obstetrics and Gynecology | Admitting: Obstetrics and Gynecology

## 2018-12-22 ENCOUNTER — Encounter: Payer: Self-pay | Admitting: Obstetrics and Gynecology

## 2018-12-22 ENCOUNTER — Other Ambulatory Visit: Payer: Self-pay

## 2018-12-22 VITALS — BP 122/70 | Wt 141.0 lb

## 2018-12-22 DIAGNOSIS — O09521 Supervision of elderly multigravida, first trimester: Secondary | ICD-10-CM

## 2018-12-22 DIAGNOSIS — Z113 Encounter for screening for infections with a predominantly sexual mode of transmission: Secondary | ICD-10-CM | POA: Insufficient documentation

## 2018-12-22 DIAGNOSIS — O0991 Supervision of high risk pregnancy, unspecified, first trimester: Secondary | ICD-10-CM

## 2018-12-22 DIAGNOSIS — F419 Anxiety disorder, unspecified: Secondary | ICD-10-CM

## 2018-12-22 DIAGNOSIS — F329 Major depressive disorder, single episode, unspecified: Secondary | ICD-10-CM

## 2018-12-22 DIAGNOSIS — O99341 Other mental disorders complicating pregnancy, first trimester: Secondary | ICD-10-CM

## 2018-12-22 DIAGNOSIS — Z3A10 10 weeks gestation of pregnancy: Secondary | ICD-10-CM

## 2018-12-22 DIAGNOSIS — N96 Recurrent pregnancy loss: Secondary | ICD-10-CM

## 2018-12-22 DIAGNOSIS — F32A Depression, unspecified: Secondary | ICD-10-CM

## 2018-12-22 DIAGNOSIS — O99340 Other mental disorders complicating pregnancy, unspecified trimester: Secondary | ICD-10-CM

## 2018-12-22 DIAGNOSIS — O219 Vomiting of pregnancy, unspecified: Secondary | ICD-10-CM

## 2018-12-22 NOTE — Progress Notes (Signed)
NOB today. Pt really anxious that something is going to go wrong with this pregnancy. Wants levels checked today

## 2018-12-22 NOTE — Progress Notes (Signed)
New Obstetric Patient H&P   Chief Complaint: "Desires prenatal care"   History of Present Illness: Patient is a 38 y.o. [id number] Not Hispanic or Latino female, presents with amenorrhea and positive home pregnancy test. Based on embryo transfer date and multiple early ultrasounds, her EDD is Estimated Date of Delivery: 07/19/19 and her EGA is [redacted]w[redacted]d. Her last pap smear was 1.5 years ago and was no abnormalities.    Since her embryo transfer she claims she has experienced nausea and vomiting. She denies vaginal bleeding. Her past medical history is notable for depression and anxiety. Her prior pregnancies are notable for no issues with her two living pregnancies.  The deliveries were uncomplicated, apart from being precipitous.   Since her LMP, she admits to the use of tobacco products  no She claims she has gained zero pounds since the start of her pregnancy.  There are cats in the home in the home  no  She admits close contact with children on a regular basis  yes  She has had chicken pox in the past yes She has had Tuberculosis exposures, symptoms, or previously tested positive for TB   no Current or past history of domestic violence. no  She had pre-implantation genetic screening for Down syndrome and many other syndromes.  Genetic Screening/Teratology Counseling: (Includes patient, baby's father, or anyone in either family with:)   79. Patient's age >/= 42 at [facility]  yes 2. Thalassemia ([place], [place], [place], or Asian background): MCV<80  no 3. Neural tube defect (meningomyelocele, spina bifida, anencephaly)  no 4. Congenital heart defect  no  5. Down syndrome  no 6. Tay-Sachs (Jewish, [place])  no 7. Canavan's Disease  no 8. Sickle cell disease or trait (African)  no  9. Hemophilia or other blood disorders  no  10. Muscular dystrophy  no  11. Cystic fibrosis  no  12. Huntington's Chorea  no  13. Mental retardation/autism  no 14. Other inherited genetic or  chromosomal disorder  no 15. Maternal metabolic disorder (DM, PKU, etc)  no 16. Patient or FOB with a child with a birth defect not listed above no  16a. Patient or FOB with a birth defect themselves no 17. Recurrent pregnancy loss, or stillbirth  yes  18. Any medications since LMP other than prenatal vitamins (include vitamins, supplements, OTC meds, drugs, alcohol)  no 19. Any other genetic/environmental exposure to discuss  no  Infection History:   1. Lives with someone with TB or TB exposed  no  2. Patient or partner has history of genital herpes  no 3. Rash or viral illness since LMP  no 4. History of STI (GC, CT, HPV, syphilis, HIV)  no 5. History of recent travel :  no  Other pertinent information:  no   Review of Systems:10 point review of systems negative unless otherwise noted in HPI  Past Medical History:  Diagnosis Date  . Depression     Past Surgical History:  Procedure Laterality Date  . DILATION AND EVACUATION N/A 06/07/2018   Procedure: DILATATION AND EVACUATION;  Surgeon: Will Bonnet, MD;  Location: [facility] ORS;  Service: Gynecology;  Laterality: N/A;    Gynecologic History: Patient's last menstrual period was 06/06/2018.  Obstetric History: [id number]  Family History  Problem Relation Age of Onset  . Skin cancer Mother 25  . Depression Mother   . Alzheimer's disease Maternal Grandmother   . Prostate cancer Maternal Grandfather   . Colon cancer Paternal Grandfather   . Hyperlipidemia Father  Social History   Socioeconomic History  . Marital status: Married    Spouse name: Not on file  . Number of children: Not on file  . Years of education: Not on file  . Highest education level: Not on file  Occupational History  . Not on file  Social Needs  . Financial resource strain: Not on file  . Food insecurity:    Worry: Not on file    Inability: Not on file  . Transportation needs:    Medical: Not on file    Non-medical: Not on file  Tobacco  Use  . Smoking status: Former Research scientist (life sciences)  . Smokeless tobacco: Never Used  Substance and Sexual Activity  . Alcohol use: Yes    Alcohol/week: 1.0 standard drinks    Types: 1 Glasses of wine per week    Comment: not during pregnancy  . Drug use: No  . Sexual activity: Yes    Partners: Male    Birth control/protection: None  Lifestyle  . Physical activity:    Days per week: Not on file    Minutes per session: Not on file  . Stress: Not on file  Relationships  . Social connections:    Talks on phone: Not on file    Gets together: Not on file    Attends religious service: Not on file    Active member of club or organization: Not on file    Attends meetings of clubs or organizations: Not on file    Relationship status: Not on file  . Intimate partner violence:    Fear of current or ex partner: Not on file    Emotionally abused: Not on file    Physically abused: Not on file    Forced sexual activity: Not on file  Other Topics Concern  . Not on file  Social History Narrative  . Not on file    Allergies  Allergen Reactions  . Oxycodone-Acetaminophen Nausea And Vomiting    Prior to Admission medications   Medication Sig Start Date End Date Taking? Authorizing Provider  sertraline (ZOLOFT) 100 MG tablet Take 1 tablet (100 mg total) by mouth daily. 04/06/18  Yes Malachy Mood, MD    Physical Exam BP 122/70   Wt 141 lb (64 kg)   LMP 06/06/2018   BMI 25.79 kg/m   Physical Exam Exam conducted with a chaperone present.  Constitutional:      General: She is not in acute distress.    Appearance: Normal appearance.  HENT:     Head: Normocephalic and atraumatic.  Eyes:     General: No scleral icterus.    Conjunctiva/sclera: Conjunctivae normal.  Neck:     Musculoskeletal: Normal range of motion and neck supple. No neck rigidity.  Cardiovascular:     Rate and Rhythm: Normal rate and regular rhythm.     Heart sounds: No murmur. No friction rub. No gallop.   Pulmonary:      Effort: Pulmonary effort is normal. No respiratory distress.     Breath sounds: Normal breath sounds. No wheezing, rhonchi or rales.  Abdominal:     General: There is no distension.     Palpations: Abdomen is soft. There is no mass.     Tenderness: There is no abdominal tenderness. There is no guarding or rebound.     Hernia: There is no hernia in the right inguinal area or left inguinal area.  Genitourinary:    General: Normal vulva.     Exam position: Lithotomy  position.     Pubic Area: No rash.      Labia:        Right: No rash, tenderness, lesion or injury.        Left: No rash, tenderness, lesion or injury.      Urethra: No prolapse or urethral lesion.     Vagina: Normal.     Cervix: Normal.     Uterus: Normal.      Adnexa: Right adnexa normal and left adnexa normal.  Musculoskeletal: Normal range of motion.        General: No swelling.  Lymphadenopathy:     Lower Body: No right inguinal adenopathy. No left inguinal adenopathy.  Skin:    General: Skin is dry.  Neurological:     General: No focal deficit present.     Mental Status: She is alert and oriented to person, place, and time.     Cranial Nerves: No cranial nerve deficit.  Psychiatric:        Mood and Affect: Mood normal.        Behavior: Behavior normal.        Judgment: Judgment normal.   BSUS: Single, living IUP with +FCA and movement. Interval growth appropriate   Female Chaperone present during breast and/or pelvic exam.  Edingurg: 21 (denies SI/HI)  Assessment: 38 y.o. [id number] at 101w1d presenting to initiate prenatal care  Plan: 1) Avoid alcoholic beverages. 2) Patient encouraged not to smoke.  3) Discontinue the use of all non-medicinal drugs and chemicals.  4) Take prenatal vitamins daily.  5) Nutrition, food safety (fish, cheese advisories, and high nitrite foods) and exercise discussed. 6) Hospital and practice style discussed with cross coverage system.  7) Genetic Screening, such as with 1st  Trimester Screening, cell free fetal DNA, AFP testing, and Ultrasound, as well as with amniocentesis and CVS as appropriate, is discussed with patient. At the conclusion of today's visit patient results reviewed genetic testing 8) Patient is asked about travel to areas at risk for the Zika virus, and counseled to avoid travel and exposure to mosquitoes or sexual partners who may have themselves been exposed to the virus. Testing is discussed, and will be ordered as appropriate.  9) depression/anxiety: she scored high on the EPDS today. But she declines increase in her medication dosage. She states that the majority of her symptoms relate to being worried about early pregnancy loss. She has a 12 week loss with her last pregnancy.  Denies Si/HI.  She verbalized warning signs of depression (such as SI/HI, etc).   10) discussed advanced maternal age >= 75 yo.  Discussed protocol for her age of checking growth mid third trimester and NSTs starting at 36 weeks.   Prentice Docker, MD 12/22/2018 10:52 AM

## 2018-12-23 LAB — RPR+RH+ABO+RUB AB+AB SCR+CB...
Antibody Screen: NEGATIVE
HIV Screen 4th Generation wRfx: NONREACTIVE
Hematocrit: 43.7 % (ref 34.0–46.6)
Hemoglobin: 14.6 g/dL (ref 11.1–15.9)
Hepatitis B Surface Ag: NEGATIVE
MCH: 29 pg (ref 26.6–33.0)
MCHC: 33.4 g/dL (ref 31.5–35.7)
MCV: 87 fL (ref 79–97)
Platelets: 305 10*3/uL (ref 150–450)
RBC: 5.03 x10E6/uL (ref 3.77–5.28)
RDW: 14.5 % (ref 11.7–15.4)
RPR Ser Ql: NONREACTIVE
Rh Factor: POSITIVE
Rubella Antibodies, IGG: 3.89 index (ref >0.99)
Varicella zoster IgG: <135 index — ABNORMAL LOW (ref >165)
WBC: 8.7 10*3/uL (ref 3.4–10.8)

## 2018-12-26 LAB — CERVICOVAGINAL ANCILLARY ONLY
Chlamydia: NEGATIVE
Neisseria Gonorrhea: NEGATIVE

## 2018-12-28 ENCOUNTER — Encounter: Payer: Self-pay | Admitting: Obstetrics and Gynecology

## 2018-12-28 ENCOUNTER — Other Ambulatory Visit: Payer: Self-pay

## 2018-12-28 ENCOUNTER — Ambulatory Visit (INDEPENDENT_AMBULATORY_CARE_PROVIDER_SITE_OTHER): Payer: 59 | Admitting: Obstetrics and Gynecology

## 2018-12-28 VITALS — BP 120/70 | Wt 140.0 lb

## 2018-12-28 DIAGNOSIS — F419 Anxiety disorder, unspecified: Secondary | ICD-10-CM

## 2018-12-28 DIAGNOSIS — O99340 Other mental disorders complicating pregnancy, unspecified trimester: Secondary | ICD-10-CM

## 2018-12-28 DIAGNOSIS — F329 Major depressive disorder, single episode, unspecified: Secondary | ICD-10-CM

## 2018-12-28 DIAGNOSIS — Z3A11 11 weeks gestation of pregnancy: Secondary | ICD-10-CM

## 2018-12-28 DIAGNOSIS — N96 Recurrent pregnancy loss: Secondary | ICD-10-CM

## 2018-12-28 DIAGNOSIS — O99341 Other mental disorders complicating pregnancy, first trimester: Secondary | ICD-10-CM

## 2018-12-28 DIAGNOSIS — O09521 Supervision of elderly multigravida, first trimester: Secondary | ICD-10-CM

## 2018-12-28 DIAGNOSIS — O0991 Supervision of high risk pregnancy, unspecified, first trimester: Secondary | ICD-10-CM

## 2018-12-28 NOTE — Progress Notes (Signed)
Routine Prenatal Care Visit  Subjective  Morgan Sullivan is a 38 y.o. P6P9509 at [redacted]w[redacted]d being seen today for ongoing prenatal care.  She is currently monitored for the following issues for this high-risk pregnancy and has Depression affecting pregnancy; Anxiety disorder affecting pregnancy, antepartum; Supervision of high-risk pregnancy; Supervision of elderly multigravida in first trimester; [redacted] weeks gestation of pregnancy; Nausea and vomiting during pregnancy; Abnormal genetic test during pregnancy; Infertility, female; Missed abortion; Family history of non-recurrent fetal loss; Hemorrhage of uterus; and Recurrent pregnancy loss on their problem list.  ----------------------------------------------------------------------------------- Patient reports no complaints.    . Vag. Bleeding: None.   . Denies leaking of fluid.  ----------------------------------------------------------------------------------- The following portions of the patient's history were reviewed and updated as appropriate: allergies, current medications, past family history, past medical history, past social history, past surgical history and problem list. Problem list updated.   Objective  Blood pressure 120/70, weight 140 lb (63.5 kg), last menstrual period 06/06/2018, unknown if currently breastfeeding. Pregravid weight 141 lb (64 kg) Total Weight Gain -1 lb (-0.454 kg) Urinalysis: Urine Protein    Urine Glucose    Fetal Status: Fetal Heart Rate (bpm): 175         General:  Alert, oriented and cooperative. Patient is in no acute distress.  Skin: Skin is warm and dry. No rash noted.   Cardiovascular: Normal heart rate noted  Respiratory: Normal respiratory effort, no problems with respiration noted  Abdomen: Soft, gravid, appropriate for gestational age. Pain/Pressure: Absent     Pelvic:  Cervical exam deferred        Extremities: Normal range of motion.     Mental Status: Normal mood and affect. Normal behavior. Normal  judgment and thought content.   Assessment   38 y.o. T2I7124 at [redacted]w[redacted]d by  07/19/2019, by Embryo Transfer presenting for routine prenatal visit  Plan   pregnancy Problems (from 12/22/18 to present)    Problem Noted Resolved   Recurrent pregnancy loss 06/22/2018 by Will Bonnet, MD No   Supervision of high-risk pregnancy 06/02/2017 by Will Bonnet, MD No   Overview Addendum 12/22/2018 10:56 AM by Will Bonnet, MD    [facility]inic [facility] Prenatal Labs  Dating Embryo transfer w u/s Blood type: O/Positive/-- (09/12 1023)   Genetic Screen Pre-implantation genetic screening accopmlished Antibody:Negative (09/12 1023)  Anatomic [place]  Rubella: 2.12 (09/12 1023) Varicella:    GTT Early:               Third trimester:  RPR: Non Reactive (09/12 1023)   Rhogam  HBsAg: Negative (09/12 1023)   TDaP vaccine                       Flu Shot: HIV:   NEG  Baby Food                                GBS:   Contraception  Pap:  CBB     CS/VBAC    Support Person               Supervision of elderly multigravida in first trimester 06/02/2017 by Will Bonnet, MD No   Overview Signed 12/22/2018 10:57 AM by Will Bonnet, MD    [ ]  growth u/s 34 weeks [ ]  NST Q week @ 36 wks [ ]  delivery by EDD          Preterm  labor symptoms and general obstetric precautions including but not limited to vaginal bleeding, contractions, leaking of fluid and fetal movement were reviewed in detail with the patient. Please refer to After Visit Summary for other counseling recommendations.   Return in about 1 week (around 01/04/2019) for Routine Prenatal Appointment/ Dr Glennon Mac in person visit.  Prentice Docker, MD, [facility], [facility] 12/28/2018 11:57 AM

## 2019-01-04 ENCOUNTER — Encounter: Payer: Self-pay | Admitting: Obstetrics and Gynecology

## 2019-01-04 ENCOUNTER — Other Ambulatory Visit: Payer: Self-pay

## 2019-01-04 ENCOUNTER — Ambulatory Visit (INDEPENDENT_AMBULATORY_CARE_PROVIDER_SITE_OTHER): Payer: 59 | Admitting: Obstetrics and Gynecology

## 2019-01-04 VITALS — BP 118/74 | Wt 144.0 lb

## 2019-01-04 DIAGNOSIS — O0991 Supervision of high risk pregnancy, unspecified, first trimester: Secondary | ICD-10-CM

## 2019-01-04 DIAGNOSIS — N96 Recurrent pregnancy loss: Secondary | ICD-10-CM

## 2019-01-04 DIAGNOSIS — O99341 Other mental disorders complicating pregnancy, first trimester: Secondary | ICD-10-CM

## 2019-01-04 DIAGNOSIS — F419 Anxiety disorder, unspecified: Secondary | ICD-10-CM

## 2019-01-04 DIAGNOSIS — O09291 Supervision of pregnancy with other poor reproductive or obstetric history, first trimester: Secondary | ICD-10-CM

## 2019-01-04 DIAGNOSIS — F32A Depression, unspecified: Secondary | ICD-10-CM

## 2019-01-04 DIAGNOSIS — F329 Major depressive disorder, single episode, unspecified: Secondary | ICD-10-CM

## 2019-01-04 DIAGNOSIS — O219 Vomiting of pregnancy, unspecified: Secondary | ICD-10-CM

## 2019-01-04 DIAGNOSIS — O99340 Other mental disorders complicating pregnancy, unspecified trimester: Secondary | ICD-10-CM

## 2019-01-04 DIAGNOSIS — Z3A12 12 weeks gestation of pregnancy: Secondary | ICD-10-CM

## 2019-01-04 DIAGNOSIS — O09521 Supervision of elderly multigravida, first trimester: Secondary | ICD-10-CM

## 2019-01-04 NOTE — Progress Notes (Signed)
Routine Prenatal Care Visit  Subjective  Morgan Sullivan is a 38 y.o. Q2V9563 at [redacted]w[redacted]d being seen today for ongoing prenatal care.  She is currently monitored for the following issues for this high-risk pregnancy and has Depression affecting pregnancy; Anxiety disorder affecting pregnancy, antepartum; Supervision of high-risk pregnancy; Supervision of elderly multigravida in first trimester; [redacted] weeks gestation of pregnancy; Nausea and vomiting during pregnancy; Abnormal genetic test during pregnancy; Infertility, female; Missed abortion; Family history of non-recurrent fetal loss; Hemorrhage of uterus; and Recurrent pregnancy loss on their problem list.  ----------------------------------------------------------------------------------- Patient reports no complaints.    . Vag. Bleeding: None.   . Denies leaking of fluid.  ----------------------------------------------------------------------------------- The following portions of the patient's history were reviewed and updated as appropriate: allergies, current medications, past family history, past medical history, past social history, past surgical history and problem list. Problem list updated.   Objective  Blood pressure 118/74, weight 144 lb (65.3 kg), last menstrual period 06/06/2018, unknown if currently breastfeeding. Pregravid weight 141 lb (64 kg) Total Weight Gain 3 lb (1.361 kg) Urinalysis: Urine Protein    Urine Glucose    Fetal Status: Fetal Heart Rate (bpm): 158         General:  Alert, oriented and cooperative. Patient is in no acute distress.  Skin: Skin is warm and dry. No rash noted.   Cardiovascular: Normal heart rate noted  Respiratory: Normal respiratory effort, no problems with respiration noted  Abdomen: Soft, gravid, appropriate for gestational age. Pain/Pressure: Absent     Pelvic:  Cervical exam deferred        Extremities: Normal range of motion.     Mental Status: Normal mood and affect. Normal behavior. Normal  judgment and thought content.   BSUS: SLIUP w +FCA, growth appropriate.  Fluid subjectively normal.  Assessment   38 y.o. O7F6433 at [redacted]w[redacted]d by  07/19/2019, by Embryo Transfer presenting for routine prenatal visit  Plan   pregnancy Problems (from 12/22/18 to present)    Problem Noted Resolved   Recurrent pregnancy loss 06/22/2018 by Will Bonnet, MD No   Supervision of high-risk pregnancy 06/02/2017 by Will Bonnet, MD No   Overview Addendum 12/22/2018 10:56 AM by Will Bonnet, MD    [facility] Prenatal Labs  Dating Embryo transfer w u/s Blood type: O/Positive/-- (09/12 1023)   Genetic Screen Pre-implantation genetic screening accopmlished Antibody:Negative (09/12 1023)  Anatomic [place]  Rubella: 2.12 (09/12 1023) Varicella:    GTT Early:               Third trimester:  RPR: Non Reactive (09/12 1023)   Rhogam  HBsAg: Negative (09/12 1023)   TDaP vaccine                       Flu Shot: HIV:   NEG  Baby Food                                GBS:   Contraception  Pap:  CBB     CS/VBAC    Support Person               Supervision of elderly multigravida in first trimester 06/02/2017 by Will Bonnet, MD No   Overview Signed 12/22/2018 10:57 AM by Will Bonnet, MD    [ ]  growth u/s 34 weeks [ ]  NST Q week @ 36 wks [ ]  delivery by  EDD          Preterm labor symptoms and general obstetric precautions including but not limited to vaginal bleeding, contractions, leaking of fluid and fetal movement were reviewed in detail with the patient. Please refer to After Visit Summary for other counseling recommendations.   Return in about 1 week (around 01/11/2019) for Routine Prenatal Appointment/in-person.  Prentice Docker, MD, [facility], [facility] 01/04/2019 4:15 PM

## 2019-01-12 ENCOUNTER — Ambulatory Visit (INDEPENDENT_AMBULATORY_CARE_PROVIDER_SITE_OTHER): Payer: 59 | Admitting: Obstetrics and Gynecology

## 2019-01-12 ENCOUNTER — Encounter: Payer: Self-pay | Admitting: Obstetrics and Gynecology

## 2019-01-12 ENCOUNTER — Other Ambulatory Visit: Payer: Self-pay

## 2019-01-12 VITALS — BP 122/74 | Wt 147.0 lb

## 2019-01-12 DIAGNOSIS — O99341 Other mental disorders complicating pregnancy, first trimester: Secondary | ICD-10-CM

## 2019-01-12 DIAGNOSIS — O99340 Other mental disorders complicating pregnancy, unspecified trimester: Secondary | ICD-10-CM

## 2019-01-12 DIAGNOSIS — F419 Anxiety disorder, unspecified: Secondary | ICD-10-CM

## 2019-01-12 DIAGNOSIS — Z3A13 13 weeks gestation of pregnancy: Secondary | ICD-10-CM

## 2019-01-12 DIAGNOSIS — F32A Depression, unspecified: Secondary | ICD-10-CM

## 2019-01-12 DIAGNOSIS — O09521 Supervision of elderly multigravida, first trimester: Secondary | ICD-10-CM

## 2019-01-12 DIAGNOSIS — O0991 Supervision of high risk pregnancy, unspecified, first trimester: Secondary | ICD-10-CM

## 2019-01-12 DIAGNOSIS — F329 Major depressive disorder, single episode, unspecified: Secondary | ICD-10-CM

## 2019-01-12 DIAGNOSIS — O219 Vomiting of pregnancy, unspecified: Secondary | ICD-10-CM

## 2019-01-12 NOTE — Progress Notes (Signed)
Routine Prenatal Care Visit  Subjective  Morgan Sullivan is a 38 y.o. E3P2951 at [redacted]w[redacted]d being seen today for ongoing prenatal care.  She is currently monitored for the following issues for this high-risk pregnancy and has Depression affecting pregnancy; Anxiety disorder affecting pregnancy, antepartum; Supervision of high-risk pregnancy; Supervision of elderly multigravida in first trimester; [redacted] weeks gestation of pregnancy; Nausea and vomiting during pregnancy; Abnormal genetic test during pregnancy; Infertility, female; Missed abortion; Family history of non-recurrent fetal loss; Hemorrhage of uterus; and Recurrent pregnancy loss on their problem list.  ----------------------------------------------------------------------------------- Patient reports no complaints.    . Vag. Bleeding: None.   . Denies leaking of fluid.  ----------------------------------------------------------------------------------- The following portions of the patient's history were reviewed and updated as appropriate: allergies, current medications, past family history, past medical history, past social history, past surgical history and problem list. Problem list updated.   Objective  Blood pressure 122/74, weight 147 lb (66.7 kg), last menstrual period 06/06/2018, unknown if currently breastfeeding. Pregravid weight 141 lb (64 kg) Total Weight Gain 6 lb (2.722 kg) Urinalysis: Urine Protein    Urine Glucose    Fetal Status: Fetal Heart Rate (bpm): 169         General:  Alert, oriented and cooperative. Patient is in no acute distress.  Skin: Skin is warm and dry. No rash noted.   Cardiovascular: Normal heart rate noted  Respiratory: Normal respiratory effort, no problems with respiration noted  Abdomen: Soft, gravid, appropriate for gestational age. Pain/Pressure: Absent     Pelvic:  Cervical exam deferred        Extremities: Normal range of motion.  Edema: None  Mental Status: Normal mood and affect. Normal  behavior. Normal judgment and thought content.   BSUS performed: normal interval growth. +FCA.  Normal fetal movement.   Assessment   38 y.o. O8C1660 at [redacted]w[redacted]d by  07/19/2019, by Embryo Transfer presenting for routine prenatal visit  Plan   pregnancy Problems (from 12/22/18 to present)    Problem Noted Resolved   Recurrent pregnancy loss 06/22/2018 by Will Bonnet, MD No   Supervision of high-risk pregnancy 06/02/2017 by Will Bonnet, MD No   Overview Addendum 12/22/2018 10:56 AM by Will Bonnet, MD    [facility] Prenatal Labs  Dating Embryo transfer w u/s Blood type: O/Positive/-- (09/12 1023)   Genetic Screen Pre-implantation genetic screening accopmlished Antibody:Negative (09/12 1023)  Anatomic [place]  Rubella: 2.12 (09/12 1023) Varicella:    GTT Early:               Third trimester:  RPR: Non Reactive (09/12 1023)   Rhogam  HBsAg: Negative (09/12 1023)   TDaP vaccine                       Flu Shot: HIV:   NEG  Baby Food                                GBS:   Contraception  Pap:  CBB     CS/VBAC    Support Person               Supervision of elderly multigravida in first trimester 06/02/2017 by Will Bonnet, MD No   Overview Signed 12/22/2018 10:57 AM by Will Bonnet, MD    [ ]  growth u/s 34 weeks [ ]  NST Q week @ 36 wks [ ]  delivery  by EDD          Preterm labor symptoms and general obstetric precautions including but not limited to vaginal bleeding, contractions, leaking of fluid and fetal movement were reviewed in detail with the patient. Please refer to After Visit Summary for other counseling recommendations.   Return in about 1 week (around 01/19/2019) for Routine Prenatal Appointment w Dr. Glennon Mac.  Prentice Docker, MD, [facility], [facility] 01/12/2019 6:20 PM

## 2019-01-18 ENCOUNTER — Telehealth: Payer: Self-pay

## 2019-01-18 NOTE — Telephone Encounter (Signed)
Pt saw a big clump of mucus (reminded her of a mucus plug) she hasn't seen anything like that before this pregnancy. She didn't know if she should be concerned. She has apt w/SDJ in the a.m. [phone]

## 2019-01-18 NOTE — Telephone Encounter (Signed)
Spoke w/pt. She denies vaginal bleeding, has minimal discomfort. Advised to monitor for spotting/bleeding or increased discomfort/cramping and to remain well hydrated. Pt admits she may not be well hydrated currently. SDJ can evaluate as needed at her apt in the morning.

## 2019-01-19 ENCOUNTER — Ambulatory Visit (INDEPENDENT_AMBULATORY_CARE_PROVIDER_SITE_OTHER): Payer: 59 | Admitting: Obstetrics and Gynecology

## 2019-01-19 ENCOUNTER — Other Ambulatory Visit: Payer: Self-pay

## 2019-01-19 ENCOUNTER — Encounter: Payer: Self-pay | Admitting: Obstetrics and Gynecology

## 2019-01-19 VITALS — BP 118/74 | Wt 145.0 lb

## 2019-01-19 DIAGNOSIS — O99342 Other mental disorders complicating pregnancy, second trimester: Secondary | ICD-10-CM

## 2019-01-19 DIAGNOSIS — O09522 Supervision of elderly multigravida, second trimester: Secondary | ICD-10-CM

## 2019-01-19 DIAGNOSIS — O99340 Other mental disorders complicating pregnancy, unspecified trimester: Secondary | ICD-10-CM

## 2019-01-19 DIAGNOSIS — F419 Anxiety disorder, unspecified: Secondary | ICD-10-CM

## 2019-01-19 DIAGNOSIS — O09819 Supervision of pregnancy resulting from assisted reproductive technology, unspecified trimester: Secondary | ICD-10-CM

## 2019-01-19 DIAGNOSIS — O219 Vomiting of pregnancy, unspecified: Secondary | ICD-10-CM

## 2019-01-19 DIAGNOSIS — O09812 Supervision of pregnancy resulting from assisted reproductive technology, second trimester: Secondary | ICD-10-CM

## 2019-01-19 DIAGNOSIS — O0992 Supervision of high risk pregnancy, unspecified, second trimester: Secondary | ICD-10-CM

## 2019-01-19 DIAGNOSIS — Z3A14 14 weeks gestation of pregnancy: Secondary | ICD-10-CM

## 2019-01-19 DIAGNOSIS — F329 Major depressive disorder, single episode, unspecified: Secondary | ICD-10-CM

## 2019-01-19 DIAGNOSIS — O09521 Supervision of elderly multigravida, first trimester: Secondary | ICD-10-CM

## 2019-01-19 NOTE — Progress Notes (Signed)
Routine Prenatal Care Visit  Subjective  Morgan Sullivan is a 38 y.o. E3P2951 at [redacted]w[redacted]d being seen today for ongoing prenatal care.  She is currently monitored for the following issues for this high-risk pregnancy and has Depression affecting pregnancy; Anxiety disorder affecting pregnancy, antepartum; Supervision of high-risk pregnancy; Supervision of elderly multigravida in first trimester; Nausea and vomiting during pregnancy; Abnormal genetic test during pregnancy; Infertility, female; Missed abortion; Family history of non-recurrent fetal loss; Hemorrhage of uterus; Recurrent pregnancy loss; and Encounter for supervision of pregnancy resulting from assisted reproductive technology on their problem list.  ----------------------------------------------------------------------------------- Patient reports no complaints.    . Vag. Bleeding: None.  Movement: Absent. Denies leaking of fluid.  ----------------------------------------------------------------------------------- The following portions of the patient's history were reviewed and updated as appropriate: allergies, current medications, past family history, past medical history, past social history, past surgical history and problem list. Problem list updated.   Objective  Blood pressure 118/74, weight 145 lb (65.8 kg), last menstrual period 06/06/2018, unknown if currently breastfeeding. Pregravid weight 141 lb (64 kg) Total Weight Gain 4 lb (1.814 kg) Urinalysis: Urine Protein    Urine Glucose    Fetal Status: Fetal Heart Rate (bpm): 158   Movement: Absent     General:  Alert, oriented and cooperative. Patient is in no acute distress.  Skin: Skin is warm and dry. No rash noted.   Cardiovascular: Normal heart rate noted  Respiratory: Normal respiratory effort, no problems with respiration noted  Abdomen: Soft, gravid, appropriate for gestational age. Pain/Pressure: Absent     Pelvic:  Cervical exam deferred        Extremities: Normal  range of motion.  Edema: None  Mental Status: Normal mood and affect. Normal behavior. Normal judgment and thought content.   Assessment   38 y.o. [id number] at [redacted]w[redacted]d by  07/19/2019, by Embryo Transfer presenting for routine prenatal visit  Plan   pregnancy Problems (from 12/22/18 to present)    Problem Noted Resolved   Encounter for supervision of pregnancy resulting from assisted reproductive technology 01/19/2019 by Will Bonnet, MD No   Recurrent pregnancy loss 06/22/2018 by Will Bonnet, MD No   Supervision of high-risk pregnancy 06/02/2017 by Will Bonnet, MD No   Overview Addendum 12/22/2018 10:56 AM by Will Bonnet, MD    [facility] Prenatal Labs  Dating Embryo transfer w u/s Blood type: O/Positive/-- (09/12 1023)   Genetic Screen Pre-implantation genetic screening accopmlished Antibody:Negative (09/12 1023)  Anatomic [place]  Rubella: 2.12 (09/12 1023) Varicella:    GTT Early:               Third trimester:  RPR: Non Reactive (09/12 1023)   Rhogam  HBsAg: Negative (09/12 1023)   TDaP vaccine                       Flu Shot: HIV:   NEG  Baby Food                                GBS:   Contraception  Pap:  CBB     CS/VBAC    Support Person               Supervision of elderly multigravida in first trimester 06/02/2017 by Will Bonnet, MD No   Overview Signed 12/22/2018 10:57 AM by Will Bonnet, MD    [ ]  growth u/s 34  weeks [ ]  NST Q week @ 36 wks [ ]  delivery by EDD          Preterm labor symptoms and general obstetric precautions including but not limited to vaginal bleeding, contractions, leaking of fluid and fetal movement were reviewed in detail with the patient. Please refer to After Visit Summary for other counseling recommendations.   Return in about 2 weeks (around 02/02/2019) for Routine prenatal with Dr Glennon Mac 5/18 (may over/double book).  Prentice Docker, MD, [facility], [facility] 01/19/2019 9:40 AM

## 2019-01-25 ENCOUNTER — Other Ambulatory Visit: Payer: Self-pay

## 2019-01-25 ENCOUNTER — Ambulatory Visit (INDEPENDENT_AMBULATORY_CARE_PROVIDER_SITE_OTHER): Payer: 59 | Admitting: Obstetrics and Gynecology

## 2019-01-25 ENCOUNTER — Encounter: Payer: Self-pay | Admitting: Obstetrics and Gynecology

## 2019-01-25 VITALS — BP 122/76 | Wt 146.0 lb

## 2019-01-25 DIAGNOSIS — Z3A15 15 weeks gestation of pregnancy: Secondary | ICD-10-CM

## 2019-01-25 DIAGNOSIS — F32A Depression, unspecified: Secondary | ICD-10-CM

## 2019-01-25 DIAGNOSIS — F329 Major depressive disorder, single episode, unspecified: Secondary | ICD-10-CM

## 2019-01-25 DIAGNOSIS — O09522 Supervision of elderly multigravida, second trimester: Secondary | ICD-10-CM

## 2019-01-25 DIAGNOSIS — F419 Anxiety disorder, unspecified: Secondary | ICD-10-CM

## 2019-01-25 DIAGNOSIS — O09819 Supervision of pregnancy resulting from assisted reproductive technology, unspecified trimester: Secondary | ICD-10-CM

## 2019-01-25 DIAGNOSIS — O0992 Supervision of high risk pregnancy, unspecified, second trimester: Secondary | ICD-10-CM

## 2019-01-25 DIAGNOSIS — O09812 Supervision of pregnancy resulting from assisted reproductive technology, second trimester: Secondary | ICD-10-CM

## 2019-01-25 DIAGNOSIS — N96 Recurrent pregnancy loss: Secondary | ICD-10-CM

## 2019-01-25 DIAGNOSIS — O99340 Other mental disorders complicating pregnancy, unspecified trimester: Secondary | ICD-10-CM

## 2019-01-25 DIAGNOSIS — O99342 Other mental disorders complicating pregnancy, second trimester: Secondary | ICD-10-CM

## 2019-01-25 NOTE — Progress Notes (Signed)
Routine Prenatal Care Visit  Subjective  Morgan Sullivan is a 38 y.o. H4V4259 at [redacted]w[redacted]d being seen today for ongoing prenatal care.  She is currently monitored for the following issues for this high-risk pregnancy and has Depression affecting pregnancy; Anxiety disorder affecting pregnancy, antepartum; Supervision of high-risk pregnancy; Supervision of elderly multigravida; Nausea and vomiting during pregnancy; Abnormal genetic test during pregnancy; Infertility, female; Missed abortion; Family history of non-recurrent fetal loss; Hemorrhage of uterus; Recurrent pregnancy loss; and Encounter for supervision of pregnancy resulting from assisted reproductive technology on their problem list.  ----------------------------------------------------------------------------------- Patient reports no complaints.   Contractions: Not present. Vag. Bleeding: None.  Movement: Absent. Denies leaking of fluid.  ----------------------------------------------------------------------------------- The following portions of the patient's history were reviewed and updated as appropriate: allergies, current medications, past family history, past medical history, past social history, past surgical history and problem list. Problem list updated.   Objective  Blood pressure 122/76, weight 146 lb (66.2 kg), last menstrual period 06/06/2018, unknown if currently breastfeeding. Pregravid weight 141 lb (64 kg) Total Weight Gain 5 lb (2.268 kg) Urinalysis: Urine Protein    Urine Glucose    Fetal Status: Fetal Heart Rate (bpm): 155   Movement: Absent     General:  Alert, oriented and cooperative. Patient is in no acute distress.  Skin: Skin is warm and dry. No rash noted.   Cardiovascular: Normal heart rate noted  Respiratory: Normal respiratory effort, no problems with respiration noted  Abdomen: Soft, gravid, appropriate for gestational age. Pain/Pressure: Absent     Pelvic:  Cervical exam deferred        Extremities:  Normal range of motion.  Edema: None  Mental Status: Normal mood and affect. Normal behavior. Normal judgment and thought content.   Assessment   38 y.o. [id number] at [redacted]w[redacted]d by  07/19/2019, by Embryo Transfer presenting for routine prenatal visit  Plan   pregnancy Problems (from 12/22/18 to present)    Problem Noted Resolved   Encounter for supervision of pregnancy resulting from assisted reproductive technology 01/19/2019 by Will Bonnet, MD No   Recurrent pregnancy loss 06/22/2018 by Will Bonnet, MD No   Supervision of high-risk pregnancy 06/02/2017 by Will Bonnet, MD No   Overview Addendum 12/22/2018 10:56 AM by Will Bonnet, MD    [facility] Prenatal Labs  Dating Embryo transfer w u/s Blood type: O/Positive/-- (09/12 1023)   Genetic Screen Pre-implantation genetic screening accopmlished Antibody:Negative (09/12 1023)  Anatomic [place]  Rubella: 2.12 (09/12 1023) Varicella:    GTT Early:               Third trimester:  RPR: Non Reactive (09/12 1023)   Rhogam  HBsAg: Negative (09/12 1023)   TDaP vaccine                       Flu Shot: HIV:   NEG  Baby Food                                GBS:   Contraception  Pap:  CBB     CS/VBAC    Support Person               Supervision of elderly multigravida 06/02/2017 by Will Bonnet, MD No   Overview Signed 12/22/2018 10:57 AM by Will Bonnet, MD    [ ]  growth u/s 34 weeks [ ]  NST Q  week @ 36 wks [ ]  delivery by EDD          Preterm labor symptoms and general obstetric precautions including but not limited to vaginal bleeding, contractions, leaking of fluid and fetal movement were reviewed in detail with the patient. Please refer to After Visit Summary for other counseling recommendations.   - Fetal echo ordered today  Return in about 2 weeks (around 02/08/2019) for Routine Prenatal Appointment (Keep previousl appt).  Prentice Docker, MD, [facility], [facility] 01/25/2019  4:51 PM

## 2019-02-06 ENCOUNTER — Ambulatory Visit (INDEPENDENT_AMBULATORY_CARE_PROVIDER_SITE_OTHER): Payer: 59 | Admitting: Obstetrics and Gynecology

## 2019-02-06 ENCOUNTER — Encounter: Payer: Self-pay | Admitting: Obstetrics and Gynecology

## 2019-02-06 ENCOUNTER — Other Ambulatory Visit: Payer: Self-pay

## 2019-02-06 VITALS — BP 124/70 | Wt 147.0 lb

## 2019-02-06 DIAGNOSIS — O09812 Supervision of pregnancy resulting from assisted reproductive technology, second trimester: Secondary | ICD-10-CM

## 2019-02-06 DIAGNOSIS — F329 Major depressive disorder, single episode, unspecified: Secondary | ICD-10-CM

## 2019-02-06 DIAGNOSIS — N96 Recurrent pregnancy loss: Secondary | ICD-10-CM

## 2019-02-06 DIAGNOSIS — O09522 Supervision of elderly multigravida, second trimester: Secondary | ICD-10-CM

## 2019-02-06 DIAGNOSIS — F419 Anxiety disorder, unspecified: Secondary | ICD-10-CM

## 2019-02-06 DIAGNOSIS — O09819 Supervision of pregnancy resulting from assisted reproductive technology, unspecified trimester: Secondary | ICD-10-CM

## 2019-02-06 DIAGNOSIS — Z3A16 16 weeks gestation of pregnancy: Secondary | ICD-10-CM

## 2019-02-06 DIAGNOSIS — O99342 Other mental disorders complicating pregnancy, second trimester: Secondary | ICD-10-CM

## 2019-02-06 DIAGNOSIS — F32A Depression, unspecified: Secondary | ICD-10-CM

## 2019-02-06 DIAGNOSIS — O0992 Supervision of high risk pregnancy, unspecified, second trimester: Secondary | ICD-10-CM

## 2019-02-06 NOTE — Progress Notes (Signed)
Routine Prenatal Care Visit  Subjective  Morgan Sullivan is a 38 y.o. [id number] at [redacted]w[redacted]d being seen today for ongoing prenatal care.  She is currently monitored for the following issues for this high-risk pregnancy and has Depression affecting pregnancy; Anxiety disorder affecting pregnancy, antepartum; Supervision of high-risk pregnancy; Supervision of elderly multigravida; Nausea and vomiting during pregnancy; Abnormal genetic test during pregnancy; Infertility, female; Missed abortion; Family history of non-recurrent fetal loss; Hemorrhage of uterus; Recurrent pregnancy loss; and Encounter for supervision of pregnancy resulting from assisted reproductive technology on their problem list.  ----------------------------------------------------------------------------------- Patient reports mild restless legs at night. She has been exercising more. Tried Benadryl last night to help with sleep. .   Contractions: Not present. Vag. Bleeding: None.  Movement: Absent. Denies leaking of fluid.  ----------------------------------------------------------------------------------- The following portions of the patient's history were reviewed and updated as appropriate: allergies, current medications, past family history, past medical history, past social history, past surgical history and problem list. Problem list updated.   Objective  Blood pressure 124/70, weight 147 lb (66.7 kg), last menstrual period 06/06/2018, unknown if currently breastfeeding. Pregravid weight 141 lb (64 kg) Total Weight Gain 6 lb (2.722 kg) Urinalysis: Urine Protein    Urine Glucose    Fetal Status: Fetal Heart Rate (bpm): 155   Movement: Absent     General:  Alert, oriented and cooperative. Patient is in no acute distress.  Skin: Skin is warm and dry. No rash noted.   Cardiovascular: Normal heart rate noted  Respiratory: Normal respiratory effort, no problems with respiration noted  Abdomen: Soft, gravid, appropriate for  gestational age. Pain/Pressure: Absent     Pelvic:  Cervical exam deferred        Extremities: Normal range of motion.  Edema: None  Mental Status: Normal mood and affect. Normal behavior. Normal judgment and thought content.   Assessment   38 y.o. H2D9242 at [redacted]w[redacted]d by  07/19/2019, by Embryo Transfer presenting for routine prenatal visit  Plan   pregnancy Problems (from 12/22/18 to present)    Problem Noted Resolved   Encounter for supervision of pregnancy resulting from assisted reproductive technology 01/19/2019 by Will Bonnet, MD No   Recurrent pregnancy loss 06/22/2018 by Will Bonnet, MD No   Supervision of high-risk pregnancy 06/02/2017 by Will Bonnet, MD No   Overview Addendum 12/22/2018 10:56 AM by Will Bonnet, MD    [facility] Prenatal [facility]  Dating Embryo transfer w u/s Blood type: O/Positive/-- (09/12 1023)   Genetic Screen Pre-implantation genetic screening accopmlished Antibody:Negative (09/12 1023)  Anatomic [place]  Rubella: 2.12 (09/12 1023) Varicella:    GTT Early:               Third trimester:  RPR: Non Reactive (09/12 1023)   Rhogam  HBsAg: Negative (09/12 1023)   TDaP vaccine                       Flu Shot: HIV:   NEG  Baby Food                                GBS:   Contraception  Pap:  CBB     CS/VBAC    Support Person               Supervision of elderly multigravida 06/02/2017 by Will Bonnet, MD No   Overview Signed 12/22/2018 10:57 AM by  Will Bonnet, MD    [ ]  growth u/s 34 weeks [ ]  NST Q week @ 36 wks [ ]  delivery by EDD          Preterm labor symptoms and general obstetric precautions including but not limited to vaginal bleeding, contractions, leaking of fluid and fetal movement were reviewed in detail with the patient. Please refer to After Visit Summary for other counseling recommendations.   Return in about 2 weeks (around 02/20/2019) for Anatomy u/s and routine prenatal after.  Prentice Docker, MD,  [facility], [facility] 02/06/2019 9:42 AM

## 2019-02-24 ENCOUNTER — Ambulatory Visit
Admission: RE | Admit: 2019-02-24 | Discharge: 2019-02-24 | Disposition: A | Discharge status: Home / Self Care | Payer: 59 | Source: Ambulatory Visit | Attending: Obstetrics and Gynecology | Admitting: Obstetrics and Gynecology

## 2019-02-24 ENCOUNTER — Other Ambulatory Visit: Payer: Self-pay

## 2019-02-24 ENCOUNTER — Ambulatory Visit (INDEPENDENT_AMBULATORY_CARE_PROVIDER_SITE_OTHER): Payer: 59 | Admitting: Obstetrics and Gynecology

## 2019-02-24 VITALS — BP 122/74 | Wt 149.0 lb

## 2019-02-24 DIAGNOSIS — F32A Depression, unspecified: Secondary | ICD-10-CM

## 2019-02-24 DIAGNOSIS — O0992 Supervision of high risk pregnancy, unspecified, second trimester: Secondary | ICD-10-CM | POA: Diagnosis not present

## 2019-02-24 DIAGNOSIS — O09812 Supervision of pregnancy resulting from assisted reproductive technology, second trimester: Secondary | ICD-10-CM

## 2019-02-24 DIAGNOSIS — O219 Vomiting of pregnancy, unspecified: Secondary | ICD-10-CM

## 2019-02-24 DIAGNOSIS — F329 Major depressive disorder, single episode, unspecified: Secondary | ICD-10-CM

## 2019-02-24 DIAGNOSIS — O99342 Other mental disorders complicating pregnancy, second trimester: Secondary | ICD-10-CM

## 2019-02-24 DIAGNOSIS — O09522 Supervision of elderly multigravida, second trimester: Secondary | ICD-10-CM

## 2019-02-24 DIAGNOSIS — O09819 Supervision of pregnancy resulting from assisted reproductive technology, unspecified trimester: Secondary | ICD-10-CM | POA: Diagnosis present

## 2019-02-24 DIAGNOSIS — Z3A19 19 weeks gestation of pregnancy: Secondary | ICD-10-CM

## 2019-02-24 DIAGNOSIS — F419 Anxiety disorder, unspecified: Secondary | ICD-10-CM

## 2019-02-24 NOTE — Progress Notes (Signed)
Routine Prenatal Care Visit  Subjective  Morgan Sullivan is a 38 y.o. [id number] at 108w2d being seen today for ongoing prenatal care.  She is currently monitored for the following issues for this high-risk pregnancy and has Depression affecting pregnancy; Anxiety disorder affecting pregnancy, antepartum; Supervision of high-risk pregnancy; Supervision of elderly multigravida; Nausea and vomiting during pregnancy; Abnormal genetic test during pregnancy; Infertility, female; Missed abortion; Family history of non-recurrent fetal loss; Hemorrhage of uterus; Recurrent pregnancy loss; and Encounter for supervision of pregnancy resulting from assisted reproductive technology on their problem list.  ----------------------------------------------------------------------------------- Patient reports no complaints.   Contractions: Not present. Vag. Bleeding: None.  Movement: Present. Denies leaking of fluid.  Anatomy U/S today normal at outpatient imaging. See report.  ----------------------------------------------------------------------------------- The following portions of the patient's history were reviewed and updated as appropriate: allergies, current medications, past family history, past medical history, past social history, past surgical history and problem list. Problem list updated.   Objective  Blood pressure 122/74, weight 149 lb (67.6 kg), last menstrual period 06/06/2018, unknown if currently breastfeeding. Pregravid weight 141 lb (64 kg) Total Weight Gain 8 lb (3.629 kg) Urinalysis: Urine Protein    Urine Glucose    Fetal Status: Fetal Heart Rate (bpm): 152   Movement: Present  Presentation: Complete Breech  General:  Alert, oriented and cooperative. Patient is in no acute distress.  Skin: Skin is warm and dry. No rash noted.   Cardiovascular: Normal heart rate noted  Respiratory: Normal respiratory effort, no problems with respiration noted  Abdomen: Soft, gravid, appropriate for  gestational age. Pain/Pressure: Absent     Pelvic:  Cervical exam deferred        Extremities: Normal range of motion.  Edema: None  Mental Status: Normal mood and affect. Normal behavior. Normal judgment and thought content.   Assessment   38 y.o. [id number] at [redacted]w[redacted]d by  07/19/2019, by Embryo Transfer presenting for routine prenatal visit  Plan   pregnancy Problems (from 12/22/18 to present)    Problem Noted Resolved   Encounter for supervision of pregnancy resulting from assisted reproductive technology 01/19/2019 by Will Bonnet, MD No   Recurrent pregnancy loss 06/22/2018 by Will Bonnet, MD No   Supervision of high-risk pregnancy 06/02/2017 by Will Bonnet, MD No   Overview Addendum 02/24/2019  3:38 PM by Will Bonnet, MD    [facility] Prenatal [facility]  Dating Embryo transfer w u/s Blood type: O/Positive/-- (09/12 1023)   Genetic Screen Pre-implantation genetic screening accopmlished Antibody:Negative (09/12 1023)  Anatomic [place] complete Rubella: 2.12 (09/12 1023) Varicella:    GTT  Third trimester:  RPR: Non Reactive (09/12 1023)   Rhogam n/a HBsAg: Negative (09/12 1023)   TDaP vaccine                       Flu Shot: HIV:   NEG  Baby Food                                GBS:   Contraception  Pap:  CBB     CS/VBAC    Support Person Warden/ranger            Supervision of elderly multigravida 06/02/2017 by Will Bonnet, MD No   Overview Signed 12/22/2018 10:57 AM by Will Bonnet, MD    [ ]  growth u/s 34 weeks [ ]  NST Q week @ 36 wks [ ]   delivery by EDD          Preterm labor symptoms and general obstetric precautions including but not limited to vaginal bleeding, contractions, leaking of fluid and fetal movement were reviewed in detail with the patient. Please refer to After Visit Summary for other counseling recommendations.   Return in about 4 weeks (around 03/24/2019) for Routine Prenatal Appointment/in-person Glennon Mac.  Prentice Docker, MD,  [facility], [facility] 02/24/2019 3:37 PM

## 2019-03-23 ENCOUNTER — Encounter: Payer: Self-pay | Admitting: Obstetrics and Gynecology

## 2019-03-23 ENCOUNTER — Ambulatory Visit (INDEPENDENT_AMBULATORY_CARE_PROVIDER_SITE_OTHER): Payer: 59 | Admitting: Obstetrics and Gynecology

## 2019-03-23 ENCOUNTER — Other Ambulatory Visit: Payer: Self-pay

## 2019-03-23 VITALS — BP 124/78 | Wt 150.0 lb

## 2019-03-23 DIAGNOSIS — N96 Recurrent pregnancy loss: Secondary | ICD-10-CM

## 2019-03-23 DIAGNOSIS — Z113 Encounter for screening for infections with a predominantly sexual mode of transmission: Secondary | ICD-10-CM

## 2019-03-23 DIAGNOSIS — Z3A23 23 weeks gestation of pregnancy: Secondary | ICD-10-CM

## 2019-03-23 DIAGNOSIS — F32A Depression, unspecified: Secondary | ICD-10-CM

## 2019-03-23 DIAGNOSIS — Z131 Encounter for screening for diabetes mellitus: Secondary | ICD-10-CM

## 2019-03-23 DIAGNOSIS — O0992 Supervision of high risk pregnancy, unspecified, second trimester: Secondary | ICD-10-CM

## 2019-03-23 DIAGNOSIS — O09812 Supervision of pregnancy resulting from assisted reproductive technology, second trimester: Secondary | ICD-10-CM

## 2019-03-23 DIAGNOSIS — O09292 Supervision of pregnancy with other poor reproductive or obstetric history, second trimester: Secondary | ICD-10-CM

## 2019-03-23 DIAGNOSIS — O99342 Other mental disorders complicating pregnancy, second trimester: Secondary | ICD-10-CM

## 2019-03-23 DIAGNOSIS — F329 Major depressive disorder, single episode, unspecified: Secondary | ICD-10-CM

## 2019-03-23 DIAGNOSIS — O09522 Supervision of elderly multigravida, second trimester: Secondary | ICD-10-CM

## 2019-03-23 DIAGNOSIS — O09819 Supervision of pregnancy resulting from assisted reproductive technology, unspecified trimester: Secondary | ICD-10-CM

## 2019-03-23 NOTE — Progress Notes (Signed)
Routine Prenatal Care Visit  Subjective  Morgan Sullivan is a 38 y.o. [id number] at [redacted]w[redacted]d being seen today for ongoing prenatal care.  She is currently monitored for the following issues for this high-risk pregnancy and has Depression affecting pregnancy; Anxiety disorder affecting pregnancy, antepartum; Supervision of high-risk pregnancy; Supervision of elderly multigravida; Nausea and vomiting during pregnancy; Abnormal genetic test during pregnancy; Infertility, female; Missed abortion; Family history of non-recurrent fetal loss; Hemorrhage of uterus; Recurrent pregnancy loss; and Encounter for supervision of pregnancy resulting from assisted reproductive technology on their problem list.  ----------------------------------------------------------------------------------- Patient reports no complaints.   Contractions: Not present. Vag. Bleeding: None.  Movement: Present. Denies leaking of fluid.  ----------------------------------------------------------------------------------- The following portions of the patient's history were reviewed and updated as appropriate: allergies, current medications, past family history, past medical history, past social history, past surgical history and problem list. Problem list updated.   Objective  Blood pressure 124/78, weight 150 lb (68 kg), last menstrual period 06/06/2018, unknown if currently breastfeeding. Pregravid weight 141 lb (64 kg) Total Weight Gain 9 lb (4.082 kg) Urinalysis: Urine Protein    Urine Glucose    Fetal Status: Fetal Heart Rate (bpm): 150   Movement: Present     General:  Alert, oriented and cooperative. Patient is in no acute distress.  Skin: Skin is warm and dry. No rash noted.   Cardiovascular: Normal heart rate noted  Respiratory: Normal respiratory effort, no problems with respiration noted  Abdomen: Soft, gravid, appropriate for gestational age. Pain/Pressure: Absent     Pelvic:  Cervical exam deferred        Extremities:  Normal range of motion.  Edema: None  Mental Status: Normal mood and affect. Normal behavior. Normal judgment and thought content.   Assessment   38 y.o. H6P5916 at [redacted]w[redacted]d by  07/19/2019, by Embryo Transfer presenting for routine prenatal visit  Plan   pregnancy Problems (from 12/22/18 to present)    Problem Noted Resolved   Encounter for supervision of pregnancy resulting from assisted reproductive technology 01/19/2019 by Will Bonnet, MD No   Recurrent pregnancy loss 06/22/2018 by Will Bonnet, MD No   Supervision of high-risk pregnancy 06/02/2017 by Will Bonnet, MD No   Overview Addendum 02/24/2019  3:38 PM by Will Bonnet, MD    [facility]atal [facility]  Dating Embryo transfer w u/s Blood type: O/Positive/-- (09/12 1023)   Genetic Screen Pre-implantation genetic screening accopmlished Antibody:Negative (09/12 1023)  Anatomic [place] complete Rubella: 2.12 (09/12 1023) Varicella:    GTT  Third trimester:  RPR: Non Reactive (09/12 1023)   Rhogam n/a HBsAg: Negative (09/12 1023)   TDaP vaccine                       Flu Shot: HIV:   NEG  Baby Food                                GBS:   Contraception  Pap:  CBB     CS/VBAC    Support Person Warden/ranger              Supervision of elderly multigravida 06/02/2017 by Will Bonnet, MD No   Overview Signed 12/22/2018 10:57 AM by Will Bonnet, MD    [ ]  growth u/s 34 weeks [ ]  NST Q week @ 36 wks [ ]  delivery by EDD  Preterm labor symptoms and general obstetric precautions including but not limited to vaginal bleeding, contractions, leaking of fluid and fetal movement were reviewed in detail with the patient. Please refer to After Visit Summary for other counseling recommendations.   - normal ECHO.  Return in about 4 weeks (around 04/20/2019) for 28 week labs, routine prenatal.  Prentice Docker, MD, [place], [facility] 03/23/2019 10:19 AM

## 2019-04-14 ENCOUNTER — Ambulatory Visit (INDEPENDENT_AMBULATORY_CARE_PROVIDER_SITE_OTHER): Payer: 59 | Admitting: Obstetrics and Gynecology

## 2019-04-14 ENCOUNTER — Other Ambulatory Visit (INDEPENDENT_AMBULATORY_CARE_PROVIDER_SITE_OTHER): Payer: 59

## 2019-04-14 ENCOUNTER — Encounter: Payer: Self-pay | Admitting: Obstetrics and Gynecology

## 2019-04-14 ENCOUNTER — Other Ambulatory Visit: Payer: Self-pay

## 2019-04-14 VITALS — BP 122/74 | Wt 155.0 lb

## 2019-04-14 DIAGNOSIS — O09812 Supervision of pregnancy resulting from assisted reproductive technology, second trimester: Secondary | ICD-10-CM

## 2019-04-14 DIAGNOSIS — O09522 Supervision of elderly multigravida, second trimester: Secondary | ICD-10-CM

## 2019-04-14 DIAGNOSIS — N96 Recurrent pregnancy loss: Secondary | ICD-10-CM

## 2019-04-14 DIAGNOSIS — F32A Depression, unspecified: Secondary | ICD-10-CM

## 2019-04-14 DIAGNOSIS — O0992 Supervision of high risk pregnancy, unspecified, second trimester: Secondary | ICD-10-CM

## 2019-04-14 DIAGNOSIS — Z3A26 26 weeks gestation of pregnancy: Secondary | ICD-10-CM

## 2019-04-14 DIAGNOSIS — F329 Major depressive disorder, single episode, unspecified: Secondary | ICD-10-CM

## 2019-04-14 DIAGNOSIS — O99342 Other mental disorders complicating pregnancy, second trimester: Secondary | ICD-10-CM

## 2019-04-14 DIAGNOSIS — Z131 Encounter for screening for diabetes mellitus: Secondary | ICD-10-CM

## 2019-04-14 DIAGNOSIS — Z113 Encounter for screening for infections with a predominantly sexual mode of transmission: Secondary | ICD-10-CM

## 2019-04-14 DIAGNOSIS — F419 Anxiety disorder, unspecified: Secondary | ICD-10-CM

## 2019-04-14 DIAGNOSIS — O219 Vomiting of pregnancy, unspecified: Secondary | ICD-10-CM

## 2019-04-14 DIAGNOSIS — O09819 Supervision of pregnancy resulting from assisted reproductive technology, unspecified trimester: Secondary | ICD-10-CM

## 2019-04-14 NOTE — Progress Notes (Signed)
Routine Prenatal Care Visit  Subjective  Morgan Sullivan is a 38 y.o. [id number] at [redacted]w[redacted]d being seen today for ongoing prenatal care.  She is currently monitored for the following issues for this high-risk pregnancy and has Depression affecting pregnancy; Anxiety disorder affecting pregnancy, antepartum; Supervision of high-risk pregnancy; Supervision of elderly multigravida; Nausea and vomiting during pregnancy; Abnormal genetic test during pregnancy; Infertility, female; Missed abortion; Family history of non-recurrent fetal loss; Hemorrhage of uterus; Recurrent pregnancy loss; and Encounter for supervision of pregnancy resulting from assisted reproductive technology on their problem list.  ----------------------------------------------------------------------------------- Patient reports no complaints.   Contractions: Not present. Vag. Bleeding: None.  Movement: Present. Denies leaking of fluid.  ----------------------------------------------------------------------------------- The following portions of the patient's history were reviewed and updated as appropriate: allergies, current medications, past family history, past medical history, past social history, past surgical history and problem list. Problem list updated.   Objective  Blood pressure 122/74, weight 155 lb (70.3 kg), last menstrual period 06/06/2018, unknown if currently breastfeeding. Pregravid weight 141 lb (64 kg) Total Weight Gain 14 lb (6.35 kg) Urinalysis: Urine Protein    Urine Glucose    Fetal Status: Fetal Heart Rate (bpm): 125 Fundal Height: 27 cm Movement: Present     General:  Alert, oriented and cooperative. Patient is in no acute distress.  Skin: Skin is warm and dry. No rash noted.   Cardiovascular: Normal heart rate noted  Respiratory: Normal respiratory effort, no problems with respiration noted  Abdomen: Soft, gravid, appropriate for gestational age. Pain/Pressure: Absent     Pelvic:  Cervical exam deferred         Extremities: Normal range of motion.  Edema: None  Mental Status: Normal mood and affect. Normal behavior. Normal judgment and thought content.   Assessment   38 y.o. [id number] at [redacted]w[redacted]d by  07/19/2019, by Embryo Transfer presenting for routine prenatal visit  Plan   pregnancy Problems (from 12/22/18 to present)    Problem Noted Resolved   Encounter for supervision of pregnancy resulting from assisted reproductive technology 01/19/2019 by Will Bonnet, MD No   Recurrent pregnancy loss 06/22/2018 by Will Bonnet, MD No   Supervision of high-risk pregnancy 06/02/2017 by Will Bonnet, MD No   Overview Addendum 02/24/2019  3:38 PM by Will Bonnet, MD    [facility] Prenatal [facility]  Dating Embryo transfer w u/s Blood type: O/Positive/-- (09/12 1023)   Genetic Screen Pre-implantation genetic screening accopmlished Antibody:Negative (09/12 1023)  Anatomic [place] complete Rubella: 2.12 (09/12 1023) Varicella:    GTT  Third trimester:  RPR: Non Reactive (09/12 1023)   Rhogam n/a HBsAg: Negative (09/12 1023)   TDaP vaccine                       Flu Shot: HIV:   NEG  Baby Food                                GBS:   Contraception  Pap:  CBB     CS/VBAC    Support Person Warden/ranger              Supervision of elderly multigravida 06/02/2017 by Will Bonnet, MD No   Overview Signed 12/22/2018 10:57 AM by Will Bonnet, MD    [ ]  growth u/s 34 weeks [ ]  NST Q week @ 36 wks [ ]  delivery by EDD  Preterm labor symptoms and general obstetric precautions including but not limited to vaginal bleeding, contractions, leaking of fluid and fetal movement were reviewed in detail with the patient. Please refer to After Visit Summary for other counseling recommendations.   - 28 week labs today  Return in about 3 weeks (around 05/05/2019) for Routine Prenatal Appointment with Dr. Glennon Mac.  Prentice Docker, MD, [facility], [facility] 04/14/2019  11:03 AM

## 2019-04-15 LAB — 28 WEEK RH+PANEL
Basophils Absolute: 0.1 10*3/uL (ref 0.0–0.2)
Basos: 1 %
EOS (ABSOLUTE): 0.1 10*3/uL (ref 0.0–0.4)
Eos: 1 %
Gestational Diabetes Screen: 103 mg/dL (ref 65–139)
HIV Screen 4th Generation wRfx: NONREACTIVE
Hematocrit: 40.9 % (ref 34.0–46.6)
Hemoglobin: 14.1 g/dL (ref 11.1–15.9)
Immature Grans (Abs): 0.4 10*3/uL — ABNORMAL HIGH (ref 0.0–0.1)
Immature Granulocytes: 3 %
Lymphocytes Absolute: 1.3 10*3/uL (ref 0.7–3.1)
Lymphs: 13 %
MCH: 30.1 pg (ref 26.6–33.0)
MCHC: 34.5 g/dL (ref 31.5–35.7)
MCV: 87 fL (ref 79–97)
Monocytes Absolute: 0.8 10*3/uL (ref 0.1–0.9)
Monocytes: 7 %
Neutrophils Absolute: 7.9 10*3/uL — ABNORMAL HIGH (ref 1.4–7.0)
Neutrophils: 75 %
Platelets: 219 10*3/uL (ref 150–450)
RBC: 4.69 x10E6/uL (ref 3.77–5.28)
RDW: 13 % (ref 11.7–15.4)
RPR Ser Ql: NONREACTIVE
WBC: 10.5 10*3/uL (ref 3.4–10.8)

## 2019-04-24 ENCOUNTER — Other Ambulatory Visit: Payer: Self-pay | Admitting: Obstetrics and Gynecology

## 2019-05-01 ENCOUNTER — Other Ambulatory Visit: Payer: Self-pay

## 2019-05-01 ENCOUNTER — Encounter: Payer: Self-pay | Admitting: Obstetrics and Gynecology

## 2019-05-01 ENCOUNTER — Ambulatory Visit (INDEPENDENT_AMBULATORY_CARE_PROVIDER_SITE_OTHER): Payer: 59 | Admitting: Obstetrics and Gynecology

## 2019-05-01 VITALS — BP 126/80 | Wt 156.0 lb

## 2019-05-01 DIAGNOSIS — F329 Major depressive disorder, single episode, unspecified: Secondary | ICD-10-CM

## 2019-05-01 DIAGNOSIS — F419 Anxiety disorder, unspecified: Secondary | ICD-10-CM

## 2019-05-01 DIAGNOSIS — O09813 Supervision of pregnancy resulting from assisted reproductive technology, third trimester: Secondary | ICD-10-CM

## 2019-05-01 DIAGNOSIS — O0993 Supervision of high risk pregnancy, unspecified, third trimester: Secondary | ICD-10-CM

## 2019-05-01 DIAGNOSIS — O09819 Supervision of pregnancy resulting from assisted reproductive technology, unspecified trimester: Secondary | ICD-10-CM

## 2019-05-01 DIAGNOSIS — Z3A28 28 weeks gestation of pregnancy: Secondary | ICD-10-CM

## 2019-05-01 DIAGNOSIS — F32A Depression, unspecified: Secondary | ICD-10-CM

## 2019-05-01 DIAGNOSIS — O99340 Other mental disorders complicating pregnancy, unspecified trimester: Secondary | ICD-10-CM

## 2019-05-01 DIAGNOSIS — O09523 Supervision of elderly multigravida, third trimester: Secondary | ICD-10-CM

## 2019-05-01 DIAGNOSIS — O99343 Other mental disorders complicating pregnancy, third trimester: Secondary | ICD-10-CM

## 2019-05-01 NOTE — Progress Notes (Signed)
Routine Prenatal Care Visit  Subjective  Morgan Sullivan is a 38 y.o. [id number] at [redacted]w[redacted]d being seen today for ongoing prenatal care.  She is currently monitored for the following issues for this high-risk pregnancy and has Depression affecting pregnancy; Anxiety disorder affecting pregnancy, antepartum; Supervision of high-risk pregnancy; Supervision of elderly multigravida; Nausea and vomiting during pregnancy; Abnormal genetic test during pregnancy; Infertility, female; Missed abortion; Family history of non-recurrent fetal loss; Hemorrhage of uterus; Recurrent pregnancy loss; and Encounter for supervision of pregnancy resulting from assisted reproductive technology on their problem list.  ----------------------------------------------------------------------------------- Patient reports no complaints.   Contractions: Not present. Vag. Bleeding: None.  Movement: Present. Denies leaking of fluid.  ----------------------------------------------------------------------------------- The following portions of the patient's history were reviewed and updated as appropriate: allergies, current medications, past family history, past medical history, past social history, past surgical history and problem list. Problem list updated.   Objective  Blood pressure 126/80, weight 156 lb (70.8 kg), last menstrual period 06/06/2018, unknown if currently breastfeeding. Pregravid weight 141 lb (64 kg) Total Weight Gain 15 lb (6.804 kg) Urinalysis: Urine Protein    Urine Glucose    Fetal Status: Fetal Heart Rate (bpm): 130 Fundal Height: 28 cm Movement: Present     General:  Alert, oriented and cooperative. Patient is in no acute distress.  Skin: Skin is warm and dry. No rash noted.   Cardiovascular: Normal heart rate noted  Respiratory: Normal respiratory effort, no problems with respiration noted  Abdomen: Soft, gravid, appropriate for gestational age. Pain/Pressure: Absent     Pelvic:  Cervical exam deferred         Extremities: Normal range of motion.  Edema: None  Mental Status: Normal mood and affect. Normal behavior. Normal judgment and thought content.   Assessment   38 y.o. [id number] at [redacted]w[redacted]d by  07/19/2019, by Embryo Transfer presenting for routine prenatal visit  Plan   pregnancy Problems (from 12/22/18 to present)    Problem Noted Resolved   Encounter for supervision of pregnancy resulting from assisted reproductive technology 01/19/2019 by Will Bonnet, MD No   Recurrent pregnancy loss 06/22/2018 by Will Bonnet, MD No   Supervision of high-risk pregnancy 06/02/2017 by Will Bonnet, MD No   Overview Addendum 02/24/2019  3:38 PM by Will Bonnet, MD    [facility]inic [facility] Prenatal Labs  Dating Embryo transfer w u/s Blood type: O/Positive/-- (09/12 1023)   Genetic Screen Pre-implantation genetic screening accopmlished Antibody:Negative (09/12 1023)  Anatomic [place] complete Rubella: 2.12 (09/12 1023) Varicella:    GTT  Third trimester:  RPR: Non Reactive (09/12 1023)   Rhogam n/a HBsAg: Negative (09/12 1023)   TDaP vaccine                       Flu Shot: HIV:   NEG  Baby Food                                GBS:   Contraception  Pap:  CBB     CS/VBAC    Support Person Morgan Sullivan              Supervision of elderly multigravida 06/02/2017 by Will Bonnet, MD No   Overview Signed 12/22/2018 10:57 AM by Will Bonnet, MD    [ ]  growth u/s 34 weeks [ ]  NST Q week @ 36 wks [ ]  delivery by EDD  Preterm labor symptoms and general obstetric precautions including but not limited to vaginal bleeding, contractions, leaking of fluid and fetal movement were reviewed in detail with the patient. Please refer to After Visit Summary for other counseling recommendations.   Return in about 2 weeks (around 05/15/2019) for Routine Prenatal Appointment.  Prentice Docker, MD, [facility], [facility] 05/01/2019 9:24 AM

## 2019-05-17 ENCOUNTER — Ambulatory Visit (INDEPENDENT_AMBULATORY_CARE_PROVIDER_SITE_OTHER): Payer: 59 | Admitting: Obstetrics and Gynecology

## 2019-05-17 ENCOUNTER — Other Ambulatory Visit: Payer: Self-pay

## 2019-05-17 ENCOUNTER — Encounter: Payer: Self-pay | Admitting: Obstetrics and Gynecology

## 2019-05-17 VITALS — BP 104/62 | Wt 158.0 lb

## 2019-05-17 DIAGNOSIS — O09813 Supervision of pregnancy resulting from assisted reproductive technology, third trimester: Secondary | ICD-10-CM

## 2019-05-17 DIAGNOSIS — O99343 Other mental disorders complicating pregnancy, third trimester: Secondary | ICD-10-CM

## 2019-05-17 DIAGNOSIS — F32A Depression, unspecified: Secondary | ICD-10-CM

## 2019-05-17 DIAGNOSIS — O09523 Supervision of elderly multigravida, third trimester: Secondary | ICD-10-CM

## 2019-05-17 DIAGNOSIS — Z23 Encounter for immunization: Secondary | ICD-10-CM

## 2019-05-17 DIAGNOSIS — O09819 Supervision of pregnancy resulting from assisted reproductive technology, unspecified trimester: Secondary | ICD-10-CM

## 2019-05-17 DIAGNOSIS — F329 Major depressive disorder, single episode, unspecified: Secondary | ICD-10-CM

## 2019-05-17 DIAGNOSIS — N96 Recurrent pregnancy loss: Secondary | ICD-10-CM

## 2019-05-17 DIAGNOSIS — F419 Anxiety disorder, unspecified: Secondary | ICD-10-CM

## 2019-05-17 DIAGNOSIS — Z3A31 31 weeks gestation of pregnancy: Secondary | ICD-10-CM

## 2019-05-17 DIAGNOSIS — O0993 Supervision of high risk pregnancy, unspecified, third trimester: Secondary | ICD-10-CM

## 2019-05-17 NOTE — Progress Notes (Signed)
Routine Prenatal Care Visit  Subjective  Morgan Sullivan is a 38 y.o. [id number] at [redacted]w[redacted]d being seen today for ongoing prenatal care.  She is currently monitored for the following issues for this high-risk pregnancy and has Depression affecting pregnancy; Anxiety disorder affecting pregnancy, antepartum; Supervision of high-risk pregnancy; Supervision of elderly multigravida; Nausea and vomiting during pregnancy; Abnormal genetic test during pregnancy; Infertility, female; Missed abortion; Family history of non-recurrent fetal loss; Hemorrhage of uterus; Recurrent pregnancy loss; and Encounter for supervision of pregnancy resulting from assisted reproductive technology on their problem list.  ----------------------------------------------------------------------------------- Patient reports no complaints.   Contractions: Not present. Vag. Bleeding: None.  Movement: Present. Leaking Fluid denies.  ----------------------------------------------------------------------------------- The following portions of the patient's history were reviewed and updated as appropriate: allergies, current medications, past family history, past medical history, past social history, past surgical history and problem list. Problem list updated.  Objective  Blood pressure 104/62, weight 158 lb (71.7 kg), last menstrual period 06/06/2018, unknown if currently breastfeeding. Pregravid weight 141 lb (64 kg) Total Weight Gain 17 lb (7.711 kg) Urinalysis: Urine Protein    Urine Glucose    Fetal Status: Fetal Heart Rate (bpm): 140 Fundal Height: 31 cm Movement: Present     General:  Alert, oriented and cooperative. Patient is in no acute distress.  Skin: Skin is warm and dry. No rash noted.   Cardiovascular: Normal heart rate noted  Respiratory: Normal respiratory effort, no problems with respiration noted  Abdomen: Soft, gravid, appropriate for gestational age. Pain/Pressure: Absent     Pelvic:  Cervical exam deferred         Extremities: Normal range of motion.  Edema: None  Mental Status: Normal mood and affect. Normal behavior. Normal judgment and thought content.   Assessment   38 y.o. [id number] at [redacted]w[redacted]d by  07/19/2019, by Embryo Transfer presenting for routine prenatal visit  Plan   pregnancy Problems (from 12/22/18 to present)    Problem Noted Resolved   Encounter for supervision of pregnancy resulting from assisted reproductive technology 01/19/2019 by Will Bonnet, MD No   Recurrent pregnancy loss 06/22/2018 by Will Bonnet, MD No   Supervision of high-risk pregnancy 06/02/2017 by Will Bonnet, MD No   Overview Addendum 02/24/2019  3:38 PM by Will Bonnet, MD    [facility] Prenatal Labs  Dating Embryo transfer w u/s Blood type: O/Positive/-- (09/12 1023)   Genetic Screen Pre-implantation genetic screening accopmlished Antibody:Negative (09/12 1023)  Anatomic [place] complete Rubella: 2.12 (09/12 1023) Varicella:    GTT  Third trimester:  RPR: Non Reactive (09/12 1023)   Rhogam n/a HBsAg: Negative (09/12 1023)   TDaP vaccine                       Flu Shot: HIV:   NEG  Baby Food                                GBS:   Contraception  Pap:  CBB     CS/VBAC    Support Person Warden/ranger              Supervision of elderly multigravida 06/02/2017 by Will Bonnet, MD No   Overview Signed 12/22/2018 10:57 AM by Will Bonnet, MD    [ ]  growth u/s 34 weeks [ ]  NST Q week @ 36 wks [ ]  delivery by EDD  Preterm labor symptoms and general obstetric precautions including but not limited to vaginal bleeding, contractions, leaking of fluid and fetal movement were reviewed in detail with the patient. Please refer to After Visit Summary for other counseling recommendations.   - TDAP today  Return in about 2 weeks (around 05/31/2019) for U/S for growth/AFI and routine prenatal with Dr. Glennon Mac.  Prentice Docker, MD, [facility], [facility] 05/17/2019  8:56 AM

## 2019-05-30 ENCOUNTER — Ambulatory Visit (INDEPENDENT_AMBULATORY_CARE_PROVIDER_SITE_OTHER): Payer: 59 | Admitting: Obstetrics and Gynecology

## 2019-05-30 ENCOUNTER — Encounter: Payer: Self-pay | Admitting: Obstetrics and Gynecology

## 2019-05-30 ENCOUNTER — Ambulatory Visit (INDEPENDENT_AMBULATORY_CARE_PROVIDER_SITE_OTHER): Payer: 59

## 2019-05-30 ENCOUNTER — Other Ambulatory Visit: Payer: Self-pay

## 2019-05-30 VITALS — BP 114/70 | Wt 161.0 lb

## 2019-05-30 DIAGNOSIS — O09523 Supervision of elderly multigravida, third trimester: Secondary | ICD-10-CM

## 2019-05-30 DIAGNOSIS — O99343 Other mental disorders complicating pregnancy, third trimester: Secondary | ICD-10-CM

## 2019-05-30 DIAGNOSIS — O0993 Supervision of high risk pregnancy, unspecified, third trimester: Secondary | ICD-10-CM

## 2019-05-30 DIAGNOSIS — F32A Depression, unspecified: Secondary | ICD-10-CM

## 2019-05-30 DIAGNOSIS — Z3A32 32 weeks gestation of pregnancy: Secondary | ICD-10-CM

## 2019-05-30 DIAGNOSIS — Z362 Encounter for other antenatal screening follow-up: Secondary | ICD-10-CM | POA: Diagnosis not present

## 2019-05-30 DIAGNOSIS — N96 Recurrent pregnancy loss: Secondary | ICD-10-CM

## 2019-05-30 DIAGNOSIS — O09819 Supervision of pregnancy resulting from assisted reproductive technology, unspecified trimester: Secondary | ICD-10-CM

## 2019-05-30 DIAGNOSIS — F419 Anxiety disorder, unspecified: Secondary | ICD-10-CM

## 2019-05-30 DIAGNOSIS — O09813 Supervision of pregnancy resulting from assisted reproductive technology, third trimester: Secondary | ICD-10-CM

## 2019-05-30 DIAGNOSIS — F329 Major depressive disorder, single episode, unspecified: Secondary | ICD-10-CM

## 2019-05-30 NOTE — Progress Notes (Signed)
Routine Prenatal Care Visit  Subjective  Morgan Sullivan is a 38 y.o. [id number] at [redacted]w[redacted]d being seen today for ongoing prenatal care.  She is currently monitored for the following issues for this high-risk pregnancy and has Depression affecting pregnancy; Anxiety disorder affecting pregnancy, antepartum; Supervision of high-risk pregnancy; Supervision of elderly multigravida; Nausea and vomiting during pregnancy; Abnormal genetic test during pregnancy; Infertility, female; Missed abortion; Family history of non-recurrent fetal loss; Hemorrhage of uterus; Recurrent pregnancy loss; and Encounter for supervision of pregnancy resulting from assisted reproductive technology on their problem list.  ----------------------------------------------------------------------------------- Patient reports no complaints.   Contractions: Not present. Vag. Bleeding: None.  Movement: Present. Leaking Fluid denies.  Growth u/s: 66.6%ile (most values low-normal, AC large), AFI 9.3 cm.   Spot BG: 61 ----------------------------------------------------------------------------------- The following portions of the patient's history were reviewed and updated as appropriate: allergies, current medications, past family history, past medical history, past social history, past surgical history and problem list. Problem list updated.  Objective  Blood pressure 114/70, weight 161 lb (73 kg), last menstrual period 06/06/2018, unknown if currently breastfeeding. Pregravid weight 141 lb (64 kg) Total Weight Gain 20 lb (9.072 kg) Urinalysis: Urine Protein    Urine Glucose    Fetal Status: Fetal Heart Rate (bpm): present-normal   Movement: Present  Presentation: Complete Breech  General:  Alert, oriented and cooperative. Patient is in no acute distress.  Skin: Skin is warm and dry. No rash noted.   Cardiovascular: Normal heart rate noted  Respiratory: Normal respiratory effort, no problems with respiration noted  Abdomen: Soft,  gravid, appropriate for gestational age. Pain/Pressure: Absent     Pelvic:  Cervical exam deferred        Extremities: Normal range of motion.  Edema: None  Mental Status: Normal mood and affect. Normal behavior. Normal judgment and thought content.   Imaging Results US Ob Follow Up  Result Date: 05/30/2019 Patient Name: Morgan Sullivan DOB: 1980/11/05 MRN: [id number] ULTRASOUND REPORT Location: [facility] Date of Service: 05/30/2019 Indications:growth/afi Findings: Morgan Sullivan intrauterine pregnancy is visualized with FHR at 143 BPM. Biometrics give an (U/S) Gestational age of [redacted]w[redacted]d and an (U/S) EDD of 07/21/2019; this correlates with the clinically established Estimated Date of Delivery: 07/19/19. Fetal presentation is Breech. Placenta: anterior. Grade: 1 AFI: 9.3 cm, MVP 3.9 cm Growth percentile is 66.6%. EFW: 2,374 g  (5 lb 4 oz) Impression: 1. [redacted]w[redacted]d Viable Singleton Intrauterine pregnancy previously established criteria. 2. Growth is 66.6 %ile.  AFI is 9.3 cm. Gweneth Dimitri, RT The ultrasound images and findings were reviewed by me and I agree with the above report. Prentice Docker, MD, [facility], [facility] 05/30/2019 8:45 AM       Assessment   38 y.o. [id number] at [redacted]w[redacted]d by  07/19/2019, by Embryo Transfer presenting for routine prenatal visit  Plan   pregnancy Problems (from 12/22/18 to present)    Problem Noted Resolved   Encounter for supervision of pregnancy resulting from assisted reproductive technology 01/19/2019 by Will Bonnet, MD No   Overview Addendum 05/30/2019  8:51 AM by Will Bonnet, MD    [x]  fetal ECHO - normal      Recurrent pregnancy loss 06/22/2018 by Will Bonnet, MD No   Supervision of high-risk pregnancy 06/02/2017 by Will Bonnet, MD No   Overview Addendum 02/24/2019  3:38 PM by Will Bonnet, MD    [facility] Prenatal Labs  Dating Embryo transfer w u/s Blood type: O/Positive/-- (09/12  1023)   Genetic Screen  Pre-implantation genetic screening accopmlished Antibody:Negative (09/12 1023)  Anatomic [place] complete Rubella: 2.12 (09/12 1023) Varicella:    GTT  Third trimester:  RPR: Non Reactive (09/12 1023)   Rhogam n/a HBsAg: Negative (09/12 1023)   TDaP vaccine                       Flu Shot: HIV:   NEG  Baby Food Breast                    GBS:   Contraception Undecided. Considering nothing Pap:  CBB     CS/VBAC    Support Person Ellard Artis              Supervision of elderly multigravida 06/02/2017 by Will Bonnet, MD No   Overview Addendum 05/30/2019  8:50 AM by Will Bonnet, MD    [x]  growth u/s [redacted]w[redacted]d  [ ]  rpt u/s @ 36 wks [ ]  NST Q week @ 36 wks [ ]  delivery by EDD          Preterm labor symptoms and general obstetric precautions including but not limited to vaginal bleeding, contractions, leaking of fluid and fetal movement were reviewed in detail with the patient. Please refer to After Visit Summary for other counseling recommendations.   Return in about 2 weeks (around 06/13/2019) for Routine Prenatal Appointment.  Prentice Docker, MD, [facility], [facility] 05/30/2019 9:13 AM

## 2019-06-13 ENCOUNTER — Other Ambulatory Visit: Payer: Self-pay

## 2019-06-13 ENCOUNTER — Encounter: Payer: Self-pay | Admitting: Obstetrics and Gynecology

## 2019-06-13 ENCOUNTER — Ambulatory Visit (INDEPENDENT_AMBULATORY_CARE_PROVIDER_SITE_OTHER): Payer: 59 | Admitting: Obstetrics and Gynecology

## 2019-06-13 VITALS — BP 120/74 | Wt 166.0 lb

## 2019-06-13 DIAGNOSIS — F329 Major depressive disorder, single episode, unspecified: Secondary | ICD-10-CM

## 2019-06-13 DIAGNOSIS — O09523 Supervision of elderly multigravida, third trimester: Secondary | ICD-10-CM

## 2019-06-13 DIAGNOSIS — O09819 Supervision of pregnancy resulting from assisted reproductive technology, unspecified trimester: Secondary | ICD-10-CM

## 2019-06-13 DIAGNOSIS — F419 Anxiety disorder, unspecified: Secondary | ICD-10-CM

## 2019-06-13 DIAGNOSIS — Z3A34 34 weeks gestation of pregnancy: Secondary | ICD-10-CM

## 2019-06-13 DIAGNOSIS — O99343 Other mental disorders complicating pregnancy, third trimester: Secondary | ICD-10-CM

## 2019-06-13 DIAGNOSIS — O321XX0 Maternal care for breech presentation, not applicable or unspecified: Secondary | ICD-10-CM

## 2019-06-13 DIAGNOSIS — O09813 Supervision of pregnancy resulting from assisted reproductive technology, third trimester: Secondary | ICD-10-CM

## 2019-06-13 DIAGNOSIS — F32A Depression, unspecified: Secondary | ICD-10-CM

## 2019-06-13 DIAGNOSIS — O0993 Supervision of high risk pregnancy, unspecified, third trimester: Secondary | ICD-10-CM

## 2019-06-13 DIAGNOSIS — N96 Recurrent pregnancy loss: Secondary | ICD-10-CM

## 2019-06-13 NOTE — Progress Notes (Signed)
Routine Prenatal Care Visit  Subjective  Morgan Sullivan is a 38 y.o. [id number] at [redacted]w[redacted]d being seen today for ongoing prenatal care.  She is currently monitored for the following issues for this high-risk pregnancy and has Depression affecting pregnancy; Anxiety disorder affecting pregnancy, antepartum; Supervision of high-risk pregnancy; Supervision of elderly multigravida; Nausea and vomiting during pregnancy; Abnormal genetic test during pregnancy; Infertility, female; Missed abortion; Family history of non-recurrent fetal loss; Hemorrhage of uterus; Recurrent pregnancy loss; and Encounter for supervision of pregnancy resulting from assisted reproductive technology on their problem list.  ----------------------------------------------------------------------------------- Patient reports no complaints.   Contractions: Not present. Vag. Bleeding: None.  Movement: Present. Leaking Fluid denies.  ----------------------------------------------------------------------------------- The following portions of the patient's history were reviewed and updated as appropriate: allergies, current medications, past family history, past medical history, past social history, past surgical history and problem list. Problem list updated.  Objective  Blood pressure 120/74, weight 166 lb (75.3 kg), last menstrual period 06/06/2018, unknown if currently breastfeeding. Pregravid weight 141 lb (64 kg) Total Weight Gain 25 lb (11.3 kg) Urinalysis: Urine Protein    Urine Glucose    Fetal Status: Fetal Heart Rate (bpm): 140 Fundal Height: 34 cm Movement: Present  Presentation: Complete Breech  General:  Alert, oriented and cooperative. Patient is in no acute distress.  Skin: Skin is warm and dry. No rash noted.   Cardiovascular: Normal heart rate noted  Respiratory: Normal respiratory effort, no problems with respiration noted  Abdomen: Soft, gravid, appropriate for gestational age. Pain/Pressure: Absent     Pelvic:   Cervical exam deferred        Extremities: Normal range of motion.  Edema: None  Mental Status: Normal mood and affect. Normal behavior. Normal judgment and thought content.   Assessment   38 y.o. [id number] at [redacted]w[redacted]d by  07/19/2019, by Embryo Transfer presenting for routine prenatal visit  Plan   pregnancy Problems (from 12/22/18 to present)    Problem Noted Resolved   Encounter for supervision of pregnancy resulting from assisted reproductive technology 01/19/2019 by Will Bonnet, MD No   Overview Addendum 05/30/2019  8:51 AM by Will Bonnet, MD    [x]  fetal ECHO - normal      Recurrent pregnancy loss 06/22/2018 by Will Bonnet, MD No   Supervision of high-risk pregnancy 06/02/2017 by Will Bonnet, MD No   Overview Addendum 02/24/2019  3:38 PM by Will Bonnet, MD    [facility] Prenatal Labs  Dating Embryo transfer w u/s Blood type: O/Positive/-- (09/12 1023)   Genetic Screen Pre-implantation genetic screening accopmlished Antibody:Negative (09/12 1023)  Anatomic [place] complete Rubella: 2.12 (09/12 1023) Varicella:    GTT  Third trimester:  RPR: Non Reactive (09/12 1023)   Rhogam n/a HBsAg: Negative (09/12 1023)   TDaP vaccine                       Flu Shot: HIV:   NEG  Baby Food                                GBS:   Contraception  Pap:  CBB     CS/VBAC    Support Person Warden/ranger              Supervision of elderly multigravida 06/02/2017 by Will Bonnet, MD No   Overview Addendum 05/30/2019  8:50 AM by Will Bonnet, MD    [  x] growth u/s [redacted]w[redacted]d  [ ]  rpt u/s @ 36 wks [ ]  NST Q week @ 36 wks [ ]  delivery by EDD          Preterm labor symptoms and general obstetric precautions including but not limited to vaginal bleeding, contractions, leaking of fluid and fetal movement were reviewed in detail with the patient. Please refer to After Visit Summary for other counseling recommendations.   - Discussed version. She will consider. Will recheck at  36 weeks and plan for version at 37 weeks, if she wants to proceed. - c-section scheduled as a backup for 10/21, pending approval by OR.  Return in about 1 week (around 06/20/2019) for 1-2 weeks for routine prenatal.  Prentice Docker, MD, [facility] [place] 06/13/2019 5:55 PM

## 2019-06-14 ENCOUNTER — Telehealth: Payer: Self-pay | Admitting: Obstetrics and Gynecology

## 2019-06-14 NOTE — Telephone Encounter (Signed)
Lmtrc

## 2019-06-14 NOTE — Telephone Encounter (Signed)
Patient is aware of H&P at [facility] on 07/03/19 @ 9:10am w/ Dr. Glennon Mac, Pre-admit Testing to be scheduled, Covid testing on 07/10/19 @ 9-10am, and OR on 07/12/19. Patient is aware she will be asked to quarantine after Covid testing. Patient is aware she may receive calls from the [facility] and [facility] Patient confirmed [employer] and no secondary insurance.

## 2019-06-23 ENCOUNTER — Other Ambulatory Visit: Payer: Self-pay

## 2019-06-23 ENCOUNTER — Other Ambulatory Visit (HOSPITAL_COMMUNITY)
Admission: RE | Admit: 2019-06-23 | Discharge: 2019-06-23 | Disposition: A | Discharge status: Home / Self Care | Payer: 59 | Source: Ambulatory Visit | Attending: Obstetrics and Gynecology | Admitting: Obstetrics and Gynecology

## 2019-06-23 ENCOUNTER — Ambulatory Visit (INDEPENDENT_AMBULATORY_CARE_PROVIDER_SITE_OTHER): Payer: 59 | Admitting: Obstetrics and Gynecology

## 2019-06-23 ENCOUNTER — Encounter: Payer: Self-pay | Admitting: Obstetrics and Gynecology

## 2019-06-23 VITALS — BP 100/70 | Wt 165.0 lb

## 2019-06-23 DIAGNOSIS — O0993 Supervision of high risk pregnancy, unspecified, third trimester: Secondary | ICD-10-CM

## 2019-06-23 DIAGNOSIS — F32A Depression, unspecified: Secondary | ICD-10-CM

## 2019-06-23 DIAGNOSIS — O09813 Supervision of pregnancy resulting from assisted reproductive technology, third trimester: Secondary | ICD-10-CM

## 2019-06-23 DIAGNOSIS — O99343 Other mental disorders complicating pregnancy, third trimester: Secondary | ICD-10-CM

## 2019-06-23 DIAGNOSIS — O321XX0 Maternal care for breech presentation, not applicable or unspecified: Secondary | ICD-10-CM | POA: Insufficient documentation

## 2019-06-23 DIAGNOSIS — O09819 Supervision of pregnancy resulting from assisted reproductive technology, unspecified trimester: Secondary | ICD-10-CM

## 2019-06-23 DIAGNOSIS — O99340 Other mental disorders complicating pregnancy, unspecified trimester: Secondary | ICD-10-CM

## 2019-06-23 DIAGNOSIS — F419 Anxiety disorder, unspecified: Secondary | ICD-10-CM

## 2019-06-23 DIAGNOSIS — Z3A36 36 weeks gestation of pregnancy: Secondary | ICD-10-CM | POA: Insufficient documentation

## 2019-06-23 DIAGNOSIS — O09523 Supervision of elderly multigravida, third trimester: Secondary | ICD-10-CM

## 2019-06-23 DIAGNOSIS — Z23 Encounter for immunization: Secondary | ICD-10-CM | POA: Diagnosis not present

## 2019-06-23 DIAGNOSIS — F329 Major depressive disorder, single episode, unspecified: Secondary | ICD-10-CM

## 2019-06-23 NOTE — Progress Notes (Signed)
Routine Prenatal Care Visit  Subjective  Morgan Sullivan is a 38 y.o. [id number] at [redacted]w[redacted]d being seen today for ongoing prenatal care.  She is currently monitored for the following issues for this high-risk pregnancy and has Depression affecting pregnancy; Anxiety disorder affecting pregnancy, antepartum; Supervision of high-risk pregnancy; Supervision of elderly multigravida; Nausea and vomiting during pregnancy; Abnormal genetic test during pregnancy; Infertility, female; Missed abortion; Family history of non-recurrent fetal loss; Hemorrhage of uterus; Recurrent pregnancy loss; Encounter for supervision of pregnancy resulting from assisted reproductive technology; and Breech presentation, no version on their problem list.  ----------------------------------------------------------------------------------- Patient reports no complaints.   Contractions: Not present. Vag. Bleeding: None.  Movement: Present. Leaking Fluid denies.  ----------------------------------------------------------------------------------- The following portions of the patient's history were reviewed and updated as appropriate: allergies, current medications, past family history, past medical history, past social history, past surgical history and problem list. Problem list updated.  Objective  Blood pressure 100/70, weight 165 lb (74.8 kg), last menstrual period 06/06/2018, unknown if currently breastfeeding. Pregravid weight 141 lb (64 kg) Total Weight Gain 24 lb (10.9 kg) Urinalysis: Urine Protein    Urine Glucose    Fetal Status: Fetal Heart Rate (bpm): 130 Fundal Height: 36 cm Movement: Present  Presentation: Complete Breech  General:  Alert, oriented and cooperative. Patient is in no acute distress.  Skin: Skin is warm and dry. No rash noted.   Cardiovascular: Normal heart rate noted  Respiratory: Normal respiratory effort, no problems with respiration noted  Abdomen: Soft, gravid, appropriate for gestational age.  Pain/Pressure: Absent     Pelvic:  Cervical exam deferred        Extremities: Normal range of motion.  Edema: None  Mental Status: Normal mood and affect. Normal behavior. Normal judgment and thought content.   BSUS: complete breech, MVP 3.6 cm, anterior placenta.  Fetal spine maternal right.  Assessment   38 y.o. [id number] at [redacted]w[redacted]d by  07/19/2019, by Embryo Transfer presenting for routine prenatal visit  Plan   pregnancy Problems (from 12/22/18 to present)    Problem Noted Resolved   Breech presentation, no version 06/23/2019 by Will Bonnet, MD No   Overview Signed 06/23/2019  4:42 PM by Will Bonnet, MD    [ ]  ECV scheduled for 10/8 at 1000 (pt arrives at 0900)      Encounter for supervision of pregnancy resulting from assisted reproductive technology 01/19/2019 by Will Bonnet, MD No   Overview Addendum 05/30/2019  8:51 AM by Will Bonnet, MD    [x]  fetal ECHO - normal      Recurrent pregnancy loss 06/22/2018 by Will Bonnet, MD No   Supervision of high-risk pregnancy 06/02/2017 by Will Bonnet, MD No   Overview Addendum 02/24/2019  3:38 PM by Will Bonnet, MD    [facility] Prenatal Labs  Dating Embryo transfer w u/s Blood type: O/Positive/-- (09/12 1023)   Genetic Screen Pre-implantation genetic screening accopmlished Antibody:Negative (09/12 1023)  Anatomic [place] complete Rubella: 2.12 (09/12 1023) Varicella:    GTT  Third trimester:  RPR: Non Reactive (09/12 1023)   Rhogam n/a HBsAg: Negative (09/12 1023)   TDaP vaccine                       Flu Shot: HIV:   NEG  Baby Food  GBS:   Contraception  Pap:  CBB     CS/VBAC    Support Person Ellard Artis              Supervision of elderly multigravida 06/02/2017 by Will Bonnet, MD No   Overview Addendum 05/30/2019  8:50 AM by Will Bonnet, MD    [x]  growth u/s [redacted]w[redacted]d  [ ]  rpt u/s @ 36 wks [ ]  NST Q week @ 36 wks [ ]  delivery by EDD           Preterm labor symptoms and general obstetric precautions including but not limited to vaginal bleeding, contractions, leaking of fluid and fetal movement were reviewed in detail with the patient. Please refer to After Visit Summary for other counseling recommendations.   Again discussed the option of ECV. Risks and benefits reviewed which include the risk of placental abruption, ROM, labor, fetal intolerance of the procedure, discomfort with the procedure, failure of procedure.  Benefits include; possible success with chance of vaginal delivery when labors.  She would like to proceed. So, scheduled for 10/8.  Approved by L&D.   Return in about 1 week (around 06/30/2019) for growth u/s (needs to be scheduled) and pre-op appt (already scheduled).  Prentice Docker, MD, [facility], [facility] 06/23/2019 4:43 PM

## 2019-06-25 LAB — STREP GP B NAA: Strep Gp B NAA: POSITIVE — AB

## 2019-06-26 ENCOUNTER — Encounter: Payer: Self-pay | Admitting: Obstetrics and Gynecology

## 2019-06-27 LAB — CERVICOVAGINAL ANCILLARY ONLY
Chlamydia: NEGATIVE
Neisseria Gonorrhea: NEGATIVE

## 2019-06-28 ENCOUNTER — Other Ambulatory Visit: Payer: Self-pay

## 2019-06-28 ENCOUNTER — Ambulatory Visit (INDEPENDENT_AMBULATORY_CARE_PROVIDER_SITE_OTHER): Payer: 59 | Admitting: Obstetrics and Gynecology

## 2019-06-28 ENCOUNTER — Encounter: Payer: Self-pay | Admitting: Obstetrics and Gynecology

## 2019-06-28 VITALS — BP 129/81 | Wt 167.0 lb

## 2019-06-28 DIAGNOSIS — O0993 Supervision of high risk pregnancy, unspecified, third trimester: Secondary | ICD-10-CM

## 2019-06-28 DIAGNOSIS — O321XX0 Maternal care for breech presentation, not applicable or unspecified: Secondary | ICD-10-CM

## 2019-06-28 DIAGNOSIS — O09819 Supervision of pregnancy resulting from assisted reproductive technology, unspecified trimester: Secondary | ICD-10-CM

## 2019-06-28 DIAGNOSIS — Z3A37 37 weeks gestation of pregnancy: Secondary | ICD-10-CM | POA: Diagnosis not present

## 2019-06-28 DIAGNOSIS — O09523 Supervision of elderly multigravida, third trimester: Secondary | ICD-10-CM

## 2019-06-28 DIAGNOSIS — O09813 Supervision of pregnancy resulting from assisted reproductive technology, third trimester: Secondary | ICD-10-CM

## 2019-06-28 DIAGNOSIS — F419 Anxiety disorder, unspecified: Secondary | ICD-10-CM

## 2019-06-28 DIAGNOSIS — F32A Depression, unspecified: Secondary | ICD-10-CM

## 2019-06-28 DIAGNOSIS — F329 Major depressive disorder, single episode, unspecified: Secondary | ICD-10-CM

## 2019-06-28 DIAGNOSIS — O99343 Other mental disorders complicating pregnancy, third trimester: Secondary | ICD-10-CM

## 2019-06-28 NOTE — Progress Notes (Signed)
Routine Prenatal Care Visit  Subjective  Morgan Sullivan is a 38 y.o. [id number] at [redacted]w[redacted]d being seen today for ongoing prenatal care.  She is currently monitored for the following issues for this high-risk pregnancy and has Depression affecting pregnancy; Anxiety disorder affecting pregnancy, antepartum; Supervision of high-risk pregnancy; Supervision of elderly multigravida; Nausea and vomiting during pregnancy; Abnormal genetic test during pregnancy; Infertility, female; Missed abortion; Family history of non-recurrent fetal loss; Hemorrhage of uterus; Recurrent pregnancy loss; Encounter for supervision of pregnancy resulting from assisted reproductive technology; and Breech presentation, no version on their problem list.  ----------------------------------------------------------------------------------- Patient reports some swelling and early-day numbness in hands (which resolves).   Contractions: Not present. Vag. Bleeding: None.  Movement: Present. Leaking Fluid denies.  ----------------------------------------------------------------------------------- The following portions of the patient's history were reviewed and updated as appropriate: allergies, current medications, past family history, past medical history, past social history, past surgical history and problem list. Problem list updated.  Objective  Blood pressure 129/81, weight 167 lb (75.8 kg), last menstrual period 06/06/2018, unknown if currently breastfeeding. Pregravid weight 141 lb (64 kg) Total Weight Gain 26 lb (11.8 kg) Urinalysis: Urine Protein    Urine Glucose    Fetal Status: Fetal Heart Rate (bpm): 150   Movement: Present     General:  Alert, oriented and cooperative. Patient is in no acute distress.  Skin: Skin is warm and dry. No rash noted.   Cardiovascular: Normal heart rate noted  Respiratory: Normal respiratory effort, no problems with respiration noted  Abdomen: Soft, gravid, appropriate for gestational age.  Pain/Pressure: Absent     Pelvic:  Cervical exam deferred        Extremities: Normal range of motion.  Edema: Trace  Mental Status: Normal mood and affect. Normal behavior. Normal judgment and thought content.   NST: Baseline FHR: 150 beats/min Variability: moderate Accelerations: present Decelerations: absent Tocometry: not done  Interpretation:  INDICATIONS: AMA, age 40y+ RESULTS:  A NST procedure was performed with FHR monitoring and a normal baseline established, appropriate time of 20-40 minutes of evaluation, and accels >2 seen w 15x15 characteristics.  Results show a REACTIVE NST.    Assessment   38 y.o. [id number] at [redacted]w[redacted]d by  07/19/2019, by Embryo Transfer presenting for routine prenatal visit  Plan   pregnancy Problems (from 12/22/18 to present)    Problem Noted Resolved   Breech presentation, no version 06/23/2019 by Will Bonnet, MD No   Overview Signed 06/23/2019  4:42 PM by Will Bonnet, MD    [ ]  ECV scheduled for 10/8 at 1000 (pt arrives at 0900) - cancelled      Encounter for supervision of pregnancy resulting from assisted reproductive technology 01/19/2019 by Will Bonnet, MD No   Overview Addendum 05/30/2019  8:51 AM by Will Bonnet, MD    [x]  fetal ECHO - normal      Recurrent pregnancy loss 06/22/2018 by Will Bonnet, MD No   Supervision of high-risk pregnancy 06/02/2017 by Will Bonnet, MD No   Overview Addendum 02/24/2019  3:38 PM by Will Bonnet, MD    Clinic [facility] Prenatal Labs  Dating Embryo transfer w u/s Blood type: O/Positive/-- (09/12 1023)   Genetic Screen Pre-implantation genetic screening accopmlished Antibody:Negative (09/12 1023)  Anatomic [place] complete Rubella: 2.12 (09/12 1023)  Varicella:  NON IMMUNE  GTT  Third trimester: 103 RPR: Non Reactive (09/12 1023)   Rhogam n/a HBsAg: Negative (09/12 1023)   TDaP vaccine   04/27/2019  Flu Shot: HIV:   NEG  Baby Food                                GBS:  positive  Contraception  Pap: 06/02/2017 NILM/HPV neg  CBB     CS/VBAC  CS due to breech   Support Person Ellard Artis            Supervision of elderly multigravida 06/02/2017 by Will Bonnet, MD No   Overview Addendum 05/30/2019  8:50 AM by Will Bonnet, MD    [x]  growth u/s [redacted]w[redacted]d  [ ]  rpt u/s @ 37 wks [ ]  NST Q week @ 37 wks [ ]  delivery by EDD          Term labor symptoms and general obstetric precautions including but not limited to vaginal bleeding, contractions, leaking of fluid and fetal movement were reviewed in detail with the patient. Please refer to After Visit Summary for other counseling recommendations.   - u/s in 2 days for growth - declined ECV - H&P with NST as previously scheduled.  Return in about 5 days (around 07/03/2019) for Please add NST to H&P appointment. Prentice Docker, MD, [facility], [facility] 06/28/2019 3:40 PM

## 2019-06-30 ENCOUNTER — Ambulatory Visit (INDEPENDENT_AMBULATORY_CARE_PROVIDER_SITE_OTHER): Payer: 59

## 2019-06-30 ENCOUNTER — Other Ambulatory Visit: Payer: Self-pay

## 2019-06-30 DIAGNOSIS — O0993 Supervision of high risk pregnancy, unspecified, third trimester: Secondary | ICD-10-CM

## 2019-06-30 DIAGNOSIS — O09819 Supervision of pregnancy resulting from assisted reproductive technology, unspecified trimester: Secondary | ICD-10-CM

## 2019-06-30 DIAGNOSIS — Z362 Encounter for other antenatal screening follow-up: Secondary | ICD-10-CM | POA: Diagnosis not present

## 2019-07-03 ENCOUNTER — Encounter: Payer: Self-pay | Admitting: Obstetrics and Gynecology

## 2019-07-03 ENCOUNTER — Ambulatory Visit (INDEPENDENT_AMBULATORY_CARE_PROVIDER_SITE_OTHER): Payer: 59 | Admitting: Obstetrics and Gynecology

## 2019-07-03 ENCOUNTER — Other Ambulatory Visit: Payer: Self-pay

## 2019-07-03 VITALS — BP 124/78 | Ht 62.0 in | Wt 168.0 lb

## 2019-07-03 DIAGNOSIS — O09523 Supervision of elderly multigravida, third trimester: Secondary | ICD-10-CM

## 2019-07-03 DIAGNOSIS — O09819 Supervision of pregnancy resulting from assisted reproductive technology, unspecified trimester: Secondary | ICD-10-CM

## 2019-07-03 DIAGNOSIS — O0993 Supervision of high risk pregnancy, unspecified, third trimester: Secondary | ICD-10-CM

## 2019-07-03 DIAGNOSIS — Z3A37 37 weeks gestation of pregnancy: Secondary | ICD-10-CM

## 2019-07-03 DIAGNOSIS — O321XX0 Maternal care for breech presentation, not applicable or unspecified: Secondary | ICD-10-CM

## 2019-07-03 DIAGNOSIS — O09813 Supervision of pregnancy resulting from assisted reproductive technology, third trimester: Secondary | ICD-10-CM

## 2019-07-03 NOTE — Progress Notes (Signed)
OB History & Physical   History of Present Illness:  Chief Complaint: Pre-operative visit for cesarean delivery for breech presentation  HPI:  Morgan Sullivan is a 38 y.o. [id number] female at [redacted]w[redacted]d dated by embryo transfer date.  Her pregnancy has been complicated by advanced maternal age, malpresentation of the fetus (breech).    She  reports occasional contractions over the weekend, but none today.   She denies leakage of fluid.   She denies vaginal bleeding.   She reports fetal movement.    Total weight gain for pregnancy: 26 lb (11.8 kg)   Obstetrical Problem List: pregnancy Problems (from 12/22/18 to present)    Problem Noted Resolved   Breech presentation, no version 06/23/2019 by Will Bonnet, MD No   Overview Signed 06/23/2019  4:42 PM by Will Bonnet, MD    [x]  ECV scheduled for 10/8 at 1000 (pt arrives at 0900) - Declines      Encounter for supervision of pregnancy resulting from assisted reproductive technology 01/19/2019 by Will Bonnet, MD No   Overview Addendum 05/30/2019  8:51 AM by Will Bonnet, MD    [x]  fetal ECHO - normal      Recurrent pregnancy loss 06/22/2018 by Will Bonnet, MD No   Supervision of high-risk pregnancy 06/02/2017 by Will Bonnet, MD No   Overview Addendum 02/24/2019  3:38 PM by Will Bonnet, MD    Clinic [facility] Prenatal Labs  Dating Embryo transfer w u/s Blood type: O/Positive/-- (09/12 1023)   Genetic Screen Pre-implantation genetic screening accopmlished Antibody:Negative (09/12 1023)  Anatomic [place] complete Rubella: 2.12 (09/12 1023) Varicella:    GTT  Third trimester:  RPR: Non Reactive (09/12 1023)   Rhogam n/a HBsAg: Negative (09/12 1023)   TDaP vaccine                       Flu Shot: HIV:   NEG  Baby Food                                GBS:   Contraception  Pap:  CBB     CS/VBAC    Support Person Warden/ranger             Supervision of elderly multigravida 06/02/2017 by Will Bonnet, MD No   Overview  Addendum 05/30/2019  8:50 AM by Will Bonnet, MD    [x]  growth u/s [redacted]w[redacted]d  [x]  rpt u/s @ 36 wks [x]  NST Q week @ 37 wks           Maternal Medical History:   Past Medical History:  Diagnosis Date  . Depression     Past Surgical History:  Procedure Laterality Date  . DILATION AND EVACUATION N/A 06/07/2018   Procedure: DILATATION AND EVACUATION;  Surgeon: Will Bonnet, MD;  Location: [facility] ORS;  Service: Gynecology;  Laterality: N/A;    Allergies  Allergen Reactions  . Oxycodone-Acetaminophen Nausea And Vomiting    Prior to Admission medications   Medication Sig Start Date End Date Taking? Authorizing Provider  aspirin EC 81 MG tablet Take 81 mg by mouth daily.    [provider]  docusate sodium (COLACE) 100 MG capsule Take 100 mg by mouth daily as needed for mild constipation.    [provider]  fluticasone (FLONASE) 50 MCG/ACT nasal spray Place 1 spray into the nose daily as needed for allergies.  07/27/18   [provider]  Iron, Ferrous Sulfate, 325 (65 Fe) MG TABS Take 325 mg by mouth daily.     [provider]  Prenatal Vit-Fe Fumarate-FA (MULTIVITAMIN-PRENATAL) 27-0.8 MG TABS tablet Take 1 tablet by mouth daily at 12 noon.    [provider]  sertraline (ZOLOFT) 100 MG tablet TAKE 1 TABLET(100 MG) BY MOUTH DAILY Patient taking differently: Take 100 mg by mouth daily.  04/24/19   Malachy Mood, MD    OB History  Gravida Para Term Preterm AB Living  6 2 2   2 2   SAB TAB Ectopic Multiple Live Births  2       2    # Outcome Date GA Lbr Len/2nd Weight Sex Delivery Anes PTL Lv  6 Current           5 SAB 2018 [redacted]w[redacted]d   F      4 Term 11/24/13   7 lb (3.175 kg) M Vag-Spont  N LIV  3 SAB 2014          2 Term 12/26/06   5 lb 14 oz (2.665 kg) F Vag-Spont  N LIV  1 Gravida             Prenatal care site: [facility]  Social History: She  reports that she has quit smoking. She has never used smokeless tobacco. She  reports current alcohol use of about 1.0 standard drinks of alcohol per week. She reports that she does not use drugs.  Family History: family history includes Alzheimer's disease in her maternal grandmother; Colon cancer in her paternal grandfather; Depression in her mother; Hyperlipidemia in her father; Prostate cancer in her maternal grandfather; Skin cancer (age of onset: 59) in her mother.   Review of Systems:  Review of Systems  Constitutional: Negative.   HENT: Negative.   Eyes: Negative.   Respiratory: Negative.   Cardiovascular: Negative.   Gastrointestinal: Negative.   Genitourinary: Negative.   Musculoskeletal: Negative.   Skin: Negative.   Neurological: Negative.   Psychiatric/Behavioral: Negative.      Physical Exam:  BP 124/78   Ht 5\' 2"  (1.575 m)   Wt 168 lb (76.2 kg)   LMP 06/06/2018   BMI 30.73 kg/m   Physical Exam Constitutional:      General: She is not in acute distress.    Appearance: Normal appearance. She is well-developed.  HENT:     Head: Normocephalic and atraumatic.  Eyes:     General: No scleral icterus.    Conjunctiva/sclera: Conjunctivae normal.  Neck:     Musculoskeletal: Normal range of motion and neck supple.  Cardiovascular:     Rate and Rhythm: Normal rate and regular rhythm.     Heart sounds: No murmur. No friction rub. No gallop.   Pulmonary:     Effort: Pulmonary effort is normal. No respiratory distress.     Breath sounds: Normal breath sounds. No wheezing or rales.  Abdominal:     General: Bowel sounds are normal. There is no distension.     Palpations: Abdomen is soft.     Tenderness: There is no abdominal tenderness. There is no guarding or rebound.     Comments: Gravid, NT  Musculoskeletal: Normal range of motion.  Neurological:     General: No focal deficit present.     Mental Status: She is alert and oriented to person, place, and time.     Cranial Nerves: No cranial nerve deficit.  Skin:  General: Skin is warm and  dry.     Findings: No erythema.  Psychiatric:        Mood and Affect: Mood normal.        Behavior: Behavior normal.        Judgment: Judgment normal.    NST:  Baseline FHR: 150 beats/min   Variability: moderate   Accelerations: present   Decelerations: absent Contractions: not tested Overall assessment: cat 1  US Ob Follow Up  Result Date: 06/30/2019 Patient Name: Morgan Sullivan DOB: 08-Nov-1980 MRN: [id number] ULTRASOUND REPORT Location: [facility] Date of Service: 06/30/2019 Indications:growth/afi Findings: Morgan Sullivan intrauterine pregnancy is visualized with FHR at 142 BPM. Biometrics give an (U/S) Gestational age of [redacted]w[redacted]d and an (U/S) EDD of 07/26/2019; this correlates with the clinically established Estimated Date of Delivery: 07/19/19. Fetal presentation is Breech. Placenta: anterior. Grade: 1 AFI: 8.3 cm Growth percentile is 59.7%. EFW: 3226 g ( 7 lb 2 oz ) Impression: 1. [redacted]w[redacted]d Viable Singleton Intrauterine pregnancy previously established criteria. 2. Growth is 59.7 %ile.  AFI is 8.3 cm. Recommendations: 1.Clinical correlation with the patient's History and Physical Exam. Gweneth Dimitri, RT There is a singleton gestation with normal amniotic fluid volume. Presentation is breech consider external cephalic version versus expectant management with C-section at 81 weeks.  The fetal biometry correlates with established dating.  Limited fetal anatomy was performed.The visualized fetal anatomical survey appears within normal limits within the resolution of ultrasound as described above.  It must be noted that a normal ultrasound is unable to rule out fetal aneuploidy.  Malachy Mood, MD, [facility], [facility] 06/30/2019, 9:39 AM    No results found for: [id number]]  Assessment:  Morgan Sullivan is a 39 y.o. [id number] female at [redacted]w[redacted]d with malpresentation of the fetus (Breech), for cesarean delivery.   Plan:  1. Admit to Labor & Delivery  2. CBC, T&S, NPO,  IVF 3. GBS positive.   4. Fetwal well-being: reassuring 5. Patient consented for primary cesarean delivery.     Prentice Docker, MD 07/03/2019 9:29 AM

## 2019-07-05 ENCOUNTER — Other Ambulatory Visit: Payer: Self-pay

## 2019-07-05 ENCOUNTER — Encounter
Admission: RE | Admit: 2019-07-05 | Discharge: 2019-07-05 | Disposition: A | Discharge status: Home / Self Care | Payer: 59 | Source: Ambulatory Visit | Attending: Obstetrics and Gynecology | Admitting: Obstetrics and Gynecology

## 2019-07-05 DIAGNOSIS — Z3A37 37 weeks gestation of pregnancy: Secondary | ICD-10-CM | POA: Diagnosis not present

## 2019-07-05 DIAGNOSIS — Z01818 Encounter for other preprocedural examination: Secondary | ICD-10-CM | POA: Diagnosis not present

## 2019-07-05 DIAGNOSIS — O321XX0 Maternal care for breech presentation, not applicable or unspecified: Secondary | ICD-10-CM | POA: Insufficient documentation

## 2019-07-05 HISTORY — DX: Gastro-esophageal reflux disease without esophagitis: K21.9

## 2019-07-05 HISTORY — DX: Anemia, unspecified: D64.9

## 2019-07-05 HISTORY — DX: Cardiac murmur, unspecified: R01.1

## 2019-07-05 NOTE — Patient Instructions (Signed)
Your procedure is scheduled on: 07-12-19 [employer] Report to [facility] TO LABOR AND DELIVERY ON THE 3RD FLOOR-ARRIVE AT 11 AM  Remember: Instructions that are not followed completely may result in serious medical risk, up to and including death, or upon the discretion of your surgeon and anesthesiologist your surgery may need to be rescheduled.    _x___ 1. Do not eat food after midnight the night before your procedure. NO GUM OR CANDY AFTER MIDNIGHT. You may drink clear liquids up to 2 hours before you are scheduled to arrive at the hospital for your procedure.  Do not drink clear liquids within 2 hours of your scheduled arrival to the hospital.  Clear liquids include  --Water or Apple juice without pulp  --Clear carbohydrate beverage such as ClearFast or Gatorade  --Black Coffee or Clear Tea (No milk, no creamers, do not add anything to the coffee or Tea)   ____Ensure clear carbohydrate drink on the way to the hospital for bariatric patients  ____Ensure clear carbohydrate drink 3 hours before surgery.    __x__ 2. No Alcohol for 24 hours before or after surgery.   __x__3. No Smoking or e-cigarettes for 24 prior to surgery.  Do not use any chewable tobacco products for at least 6 hour prior to surgery   ____  4. Bring all medications with you on the day of surgery if instructed.    __x__ 5. Notify your doctor if there is any change in your medical condition     (cold, fever, infections).    x___6. On the morning of surgery brush your teeth with toothpaste and water.  You may rinse your mouth with mouth wash if you wish.  Do not swallow any toothpaste or mouthwash.   Do not wear jewelry, make-up, hairpins, clips or nail polish.  Do not wear lotions, powders, or perfumes. You may wear deodorant.  Do not shave 48 hours prior to surgery. Men may shave face and neck.  Do not bring valuables to the hospital.    [facility] is not responsible for any belongings or  valuables.               Contacts, dentures or bridgework may not be worn into surgery.  Leave your suitcase in the car. After surgery it may be brought to your room.  For patients admitted to the hospital, discharge time is determined by your treatment team.  _  Patients discharged the day of surgery will not be allowed to drive home.  You will need someone to drive you home and stay with you the night of your procedure.    Please read over the following fact sheets that you were given:   [facility] Preparing for Surgery   _x___ TAKE THE FOLLOWING MEDICATION THE MORNING OF SURGERY WITH A SMALL SIP OF WATER. These include:  1. ZOLOFT (SERTRALINE)  2.  3.  4.  5.  6.  ____Fleets enema or Magnesium Citrate as directed.   _x___ Use CHG Soap or sage wipes as directed on instruction sheet-AVOID NIPPLE AND PRIVATE AREA  ____ Use inhalers on the day of surgery and bring to hospital day of surgery  ____ Stop Metformin and Janumet 2 days prior to surgery.    ____ Take 1/2 of usual insulin dose the night before surgery and none on the morning surgery.   ____ Follow recommendations from Cardiologist, Pulmonologist or PCP regarding stopping Aspirin, Coumadin, Plavix ,Eliquis, Effient, or Pradaxa, and Pletal.  X____Stop Anti-inflammatories such as Advil, Aleve, Ibuprofen, Motrin, Naproxen, Naprosyn, Goodies powders or aspirin products NOW-OK to take Tylenol    _x___ Stop supplements until after surgery-STOP MELATONIN NOW-MAY RESUME AFTER SURGERY   ____ Bring C-Pap to the hospital.

## 2019-07-07 ENCOUNTER — Other Ambulatory Visit: Payer: Self-pay

## 2019-07-07 ENCOUNTER — Other Ambulatory Visit
Admission: RE | Admit: 2019-07-07 | Discharge: 2019-07-07 | Disposition: A | Discharge status: Home / Self Care | Payer: 59 | Source: Ambulatory Visit | Attending: Obstetrics and Gynecology | Admitting: Obstetrics and Gynecology

## 2019-07-07 DIAGNOSIS — Z20828 Contact with and (suspected) exposure to other viral communicable diseases: Secondary | ICD-10-CM | POA: Insufficient documentation

## 2019-07-07 LAB — SARS CORONAVIRUS 2 (TAT 6-24 HRS): SARS Coronavirus 2: NEGATIVE

## 2019-07-10 ENCOUNTER — Encounter: Payer: Self-pay | Admitting: Anesthesiology

## 2019-07-10 ENCOUNTER — Other Ambulatory Visit: Payer: Self-pay

## 2019-07-10 ENCOUNTER — Encounter
Admission: EM | Disposition: A | Discharge status: Home / Self Care | Payer: Self-pay | Source: Home / Self Care | Attending: Obstetrics and Gynecology

## 2019-07-10 ENCOUNTER — Observation Stay: Payer: 59 | Admitting: Anesthesiology

## 2019-07-10 ENCOUNTER — Inpatient Hospital Stay
Admission: EM | Admit: 2019-07-10 | Discharge: 2019-07-12 | DRG: 787 | Disposition: A | Discharge status: Home / Self Care | Payer: 59 | Attending: Obstetrics and Gynecology | Admitting: Obstetrics and Gynecology

## 2019-07-10 DIAGNOSIS — O4202 Full-term premature rupture of membranes, onset of labor within 24 hours of rupture: Secondary | ICD-10-CM | POA: Diagnosis not present

## 2019-07-10 DIAGNOSIS — Z3A38 38 weeks gestation of pregnancy: Secondary | ICD-10-CM

## 2019-07-10 DIAGNOSIS — Z20828 Contact with and (suspected) exposure to other viral communicable diseases: Secondary | ICD-10-CM | POA: Diagnosis present

## 2019-07-10 DIAGNOSIS — O0993 Supervision of high risk pregnancy, unspecified, third trimester: Secondary | ICD-10-CM

## 2019-07-10 DIAGNOSIS — D62 Acute posthemorrhagic anemia: Secondary | ICD-10-CM | POA: Diagnosis not present

## 2019-07-10 DIAGNOSIS — O321XX0 Maternal care for breech presentation, not applicable or unspecified: Secondary | ICD-10-CM | POA: Diagnosis present

## 2019-07-10 DIAGNOSIS — O9081 Anemia of the puerperium: Secondary | ICD-10-CM | POA: Diagnosis not present

## 2019-07-10 DIAGNOSIS — O09523 Supervision of elderly multigravida, third trimester: Secondary | ICD-10-CM

## 2019-07-10 DIAGNOSIS — O09819 Supervision of pregnancy resulting from assisted reproductive technology, unspecified trimester: Secondary | ICD-10-CM

## 2019-07-10 DIAGNOSIS — O329XX0 Maternal care for malpresentation of fetus, unspecified, not applicable or unspecified: Secondary | ICD-10-CM | POA: Diagnosis present

## 2019-07-10 DIAGNOSIS — N96 Recurrent pregnancy loss: Secondary | ICD-10-CM

## 2019-07-10 LAB — CBC
HCT: 39.8 % (ref 36.0–46.0)
Hemoglobin: 13.8 g/dL (ref 12.0–15.0)
MCH: 30.3 pg (ref 26.0–34.0)
MCHC: 34.7 g/dL (ref 30.0–36.0)
MCV: 87.5 fL (ref 80.0–100.0)
Platelets: 197 10*3/uL (ref 150–400)
RBC: 4.55 MIL/uL (ref 3.87–5.11)
RDW: 12.2 % (ref 11.5–15.5)
WBC: 11.5 10*3/uL — ABNORMAL HIGH (ref 4.0–10.5)
nRBC: 0 % (ref 0.0–0.2)

## 2019-07-10 LAB — TYPE AND SCREEN
ABO/RH(D): O POS
Antibody Screen: NEGATIVE

## 2019-07-10 SURGERY — Surgical Case
Anesthesia: Spinal

## 2019-07-10 MED ORDER — MEPERIDINE HCL 25 MG/ML IJ SOLN: 6.2500 mg | INTRAMUSCULAR | Status: DC | PRN

## 2019-07-10 MED ORDER — SODIUM CHLORIDE 0.9 % IV SOLN
INTRAVENOUS | Status: DC | PRN
  Administered 2019-07-10: 35 ug/min via INTRAVENOUS

## 2019-07-10 MED ORDER — SERTRALINE HCL 100 MG PO TABS
100.0000 mg | ORAL_TABLET | ORAL | Status: DC
  Administered 2019-07-11 – 2019-07-12 (×2): 100 mg via ORAL
  Filled 2019-07-10 (×3): qty 1

## 2019-07-10 MED ORDER — PHENYLEPHRINE HCL (PRESSORS) 10 MG/ML IV SOLN
INTRAVENOUS | Status: DC | PRN
  Administered 2019-07-10: 100 ug via INTRAVENOUS

## 2019-07-10 MED ORDER — OXYTOCIN 40 UNITS IN NORMAL SALINE INFUSION - SIMPLE MED
INTRAVENOUS | Status: AC
  Filled 2019-07-10: qty 1000

## 2019-07-10 MED ORDER — MENTHOL 3 MG MT LOZG
1.0000 | LOZENGE | OROMUCOSAL | Status: DC | PRN
  Filled 2019-07-10: qty 9

## 2019-07-10 MED ORDER — ONDANSETRON HCL 4 MG/2ML IJ SOLN: 4.0000 mg | Freq: Once | INTRAMUSCULAR | Status: DC | PRN

## 2019-07-10 MED ORDER — DIPHENHYDRAMINE HCL 25 MG PO CAPS: 25.0000 mg | ORAL_CAPSULE | ORAL | Status: DC | PRN

## 2019-07-10 MED ORDER — OXYTOCIN 40 UNITS IN NORMAL SALINE INFUSION - SIMPLE MED
INTRAVENOUS | Status: DC | PRN
  Administered 2019-07-10: 300 mL via INTRAVENOUS

## 2019-07-10 MED ORDER — PRENATAL MULTIVITAMIN CH
1.0000 | ORAL_TABLET | Freq: Every day | ORAL | Status: DC
  Administered 2019-07-11 – 2019-07-12 (×2): 1 via ORAL
  Filled 2019-07-10 (×2): qty 1

## 2019-07-10 MED ORDER — WITCH HAZEL-GLYCERIN EX PADS: 1.0000 "application " | MEDICATED_PAD | CUTANEOUS | Status: DC | PRN

## 2019-07-10 MED ORDER — NALBUPHINE HCL 10 MG/ML IJ SOLN: 5.0000 mg | Freq: Once | INTRAMUSCULAR | Status: DC | PRN

## 2019-07-10 MED ORDER — SODIUM CHLORIDE 0.9% FLUSH: 3.0000 mL | INTRAVENOUS | Status: DC | PRN

## 2019-07-10 MED ORDER — LACTATED RINGERS IV BOLUS
1000.0000 mL | Freq: Once | INTRAVENOUS | Status: AC
  Administered 2019-07-10: 16:00:00 1000 mL via INTRAVENOUS

## 2019-07-10 MED ORDER — FENTANYL CITRATE (PF) 100 MCG/2ML IJ SOLN
INTRAMUSCULAR | Status: DC | PRN
  Administered 2019-07-10: 20 ug via INTRATHECAL

## 2019-07-10 MED ORDER — ONDANSETRON HCL 4 MG/2ML IJ SOLN: 4.0000 mg | Freq: Three times a day (TID) | INTRAMUSCULAR | Status: DC | PRN

## 2019-07-10 MED ORDER — OXYTOCIN BOLUS FROM INFUSION: 500.0000 mL | Freq: Once | INTRAVENOUS | Status: DC

## 2019-07-10 MED ORDER — FENTANYL CITRATE (PF) 100 MCG/2ML IJ SOLN: 25.0000 ug | INTRAMUSCULAR | Status: DC | PRN

## 2019-07-10 MED ORDER — NALBUPHINE HCL 10 MG/ML IJ SOLN: 5.0000 mg | INTRAMUSCULAR | Status: DC | PRN

## 2019-07-10 MED ORDER — FENTANYL CITRATE (PF) 100 MCG/2ML IJ SOLN
INTRAMUSCULAR | Status: AC
  Filled 2019-07-10: qty 2

## 2019-07-10 MED ORDER — LACTATED RINGERS IV SOLN: INTRAVENOUS | Status: DC

## 2019-07-10 MED ORDER — KETOROLAC TROMETHAMINE 30 MG/ML IJ SOLN: 30.0000 mg | Freq: Four times a day (QID) | INTRAMUSCULAR | Status: AC | PRN

## 2019-07-10 MED ORDER — BUPIVACAINE 0.25 % ON-Q PUMP DUAL CATH 400 ML
400.0000 mL | INJECTION | Status: DC
  Filled 2019-07-10: qty 400

## 2019-07-10 MED ORDER — LACTATED RINGERS IV SOLN: 500.0000 mL | INTRAVENOUS | Status: DC | PRN

## 2019-07-10 MED ORDER — DIBUCAINE (PERIANAL) 1 % EX OINT: 1.0000 "application " | TOPICAL_OINTMENT | CUTANEOUS | Status: DC | PRN

## 2019-07-10 MED ORDER — ONDANSETRON HCL 4 MG/2ML IJ SOLN
INTRAMUSCULAR | Status: DC | PRN
  Administered 2019-07-10: 4 mg via INTRAVENOUS

## 2019-07-10 MED ORDER — LACTATED RINGERS IV SOLN
Freq: Once | INTRAVENOUS | Status: AC
  Administered 2019-07-10: 11:00:00 via INTRAVENOUS

## 2019-07-10 MED ORDER — OXYTOCIN 40 UNITS IN NORMAL SALINE INFUSION - SIMPLE MED
2.5000 [IU]/h | INTRAVENOUS | Status: AC
  Filled 2019-07-10: qty 1000

## 2019-07-10 MED ORDER — HYDROMORPHONE HCL 2 MG PO TABS: 2.0000 mg | ORAL_TABLET | ORAL | Status: DC | PRN

## 2019-07-10 MED ORDER — SIMETHICONE 80 MG PO CHEW
80.0000 mg | CHEWABLE_TABLET | Freq: Three times a day (TID) | ORAL | Status: DC
  Administered 2019-07-10 – 2019-07-12 (×6): 80 mg via ORAL
  Filled 2019-07-10 (×6): qty 1

## 2019-07-10 MED ORDER — NALOXONE HCL 4 MG/10ML IJ SOLN
1.0000 ug/kg/h | INTRAVENOUS | Status: DC | PRN
  Filled 2019-07-10: qty 5

## 2019-07-10 MED ORDER — SIMETHICONE 80 MG PO CHEW: 80.0000 mg | CHEWABLE_TABLET | ORAL | Status: DC | PRN

## 2019-07-10 MED ORDER — DIPHENHYDRAMINE HCL 25 MG PO CAPS: 25.0000 mg | ORAL_CAPSULE | Freq: Four times a day (QID) | ORAL | Status: DC | PRN

## 2019-07-10 MED ORDER — IBUPROFEN 800 MG PO TABS
800.0000 mg | ORAL_TABLET | Freq: Three times a day (TID) | ORAL | Status: DC
  Administered 2019-07-11 – 2019-07-12 (×3): 800 mg via ORAL
  Filled 2019-07-10 (×3): qty 1

## 2019-07-10 MED ORDER — PHENYLEPHRINE HCL (PRESSORS) 10 MG/ML IV SOLN
INTRAVENOUS | Status: AC
  Filled 2019-07-10: qty 1

## 2019-07-10 MED ORDER — SIMETHICONE 80 MG PO CHEW: 80.0000 mg | CHEWABLE_TABLET | ORAL | Status: DC

## 2019-07-10 MED ORDER — TETANUS-DIPHTH-ACELL PERTUSSIS 5-2.5-18.5 LF-MCG/0.5 IM SUSP: 0.5000 mL | Freq: Once | INTRAMUSCULAR | Status: DC

## 2019-07-10 MED ORDER — COCONUT OIL OIL
1.0000 "application " | TOPICAL_OIL | Status: DC | PRN
  Filled 2019-07-10: qty 120

## 2019-07-10 MED ORDER — MORPHINE SULFATE (PF) 0.5 MG/ML IJ SOLN
INTRAMUSCULAR | Status: AC
  Filled 2019-07-10: qty 10

## 2019-07-10 MED ORDER — BUPIVACAINE IN DEXTROSE 0.75-8.25 % IT SOLN
INTRATHECAL | Status: DC | PRN
  Administered 2019-07-10: 1.6 mL via INTRATHECAL

## 2019-07-10 MED ORDER — LACTATED RINGERS IV SOLN: Freq: Once | INTRAVENOUS | Status: DC

## 2019-07-10 MED ORDER — OXYTOCIN 40 UNITS IN NORMAL SALINE INFUSION - SIMPLE MED
2.5000 [IU]/h | INTRAVENOUS | Status: DC
  Administered 2019-07-10: 14:00:00 2.5 [IU]/h via INTRAVENOUS

## 2019-07-10 MED ORDER — NALOXONE HCL 0.4 MG/ML IJ SOLN: 0.4000 mg | INTRAMUSCULAR | Status: DC | PRN

## 2019-07-10 MED ORDER — SENNOSIDES-DOCUSATE SODIUM 8.6-50 MG PO TABS
2.0000 | ORAL_TABLET | ORAL | Status: DC
  Administered 2019-07-11 – 2019-07-12 (×2): 2 via ORAL
  Filled 2019-07-10 (×2): qty 2

## 2019-07-10 MED ORDER — LIDOCAINE HCL (PF) 1 % IJ SOLN: 30.0000 mL | INTRAMUSCULAR | Status: DC | PRN

## 2019-07-10 MED ORDER — BUPIVACAINE HCL (PF) 0.5 % IJ SOLN
INTRAMUSCULAR | Status: DC | PRN
  Administered 2019-07-10: 10 mL

## 2019-07-10 MED ORDER — SOD CITRATE-CITRIC ACID 500-334 MG/5ML PO SOLN
ORAL | Status: AC
  Administered 2019-07-10: 12:00:00 30 mL
  Filled 2019-07-10: qty 30

## 2019-07-10 MED ORDER — KETOROLAC TROMETHAMINE 30 MG/ML IJ SOLN
30.0000 mg | Freq: Four times a day (QID) | INTRAMUSCULAR | Status: AC
  Administered 2019-07-10 – 2019-07-11 (×3): 30 mg via INTRAVENOUS
  Filled 2019-07-10 (×3): qty 1

## 2019-07-10 MED ORDER — DEXAMETHASONE SODIUM PHOSPHATE 4 MG/ML IJ SOLN
INTRAMUSCULAR | Status: DC | PRN
  Administered 2019-07-10: 10 mg via INTRAVENOUS

## 2019-07-10 MED ORDER — MORPHINE SULFATE (PF) 0.5 MG/ML IJ SOLN
INTRAMUSCULAR | Status: DC | PRN
  Administered 2019-07-10: .1 mg via INTRATHECAL

## 2019-07-10 MED ORDER — CEFAZOLIN SODIUM-DEXTROSE 2-4 GM/100ML-% IV SOLN
2.0000 g | Freq: Once | INTRAVENOUS | Status: AC
  Administered 2019-07-10: 12:00:00 2 g via INTRAVENOUS
  Filled 2019-07-10: qty 100

## 2019-07-10 MED ORDER — MINERAL OIL LIGHT OIL
TOPICAL_OIL | Freq: Once | Status: DC
  Filled 2019-07-10: qty 10

## 2019-07-10 MED ORDER — ACETAMINOPHEN 500 MG PO TABS
1000.0000 mg | ORAL_TABLET | Freq: Four times a day (QID) | ORAL | Status: AC
  Administered 2019-07-10 – 2019-07-11 (×4): 1000 mg via ORAL
  Filled 2019-07-10 (×4): qty 2

## 2019-07-10 MED ORDER — DIPHENHYDRAMINE HCL 50 MG/ML IJ SOLN: 12.5000 mg | INTRAMUSCULAR | Status: DC | PRN

## 2019-07-10 MED ORDER — BUPIVACAINE HCL (PF) 0.5 % IJ SOLN
INTRAMUSCULAR | Status: AC
  Filled 2019-07-10: qty 30

## 2019-07-10 SURGICAL SUPPLY — 39 items
ADH SKN CLS APL DERMABOND .7 (GAUZE/BANDAGES/DRESSINGS) ×1
APL PRP STRL LF DISP 70% ISPRP (MISCELLANEOUS) ×1
BARRIER ADHS 3X4 INTERCEED (GAUZE/BANDAGES/DRESSINGS) ×2 IMPLANT
BRR ADH 4X3 ABS CNTRL BYND (GAUZE/BANDAGES/DRESSINGS) ×1
CANISTER SUCT 3000ML PPV (MISCELLANEOUS) ×2 IMPLANT
CATH KIT ON-Q SILVERSOAK 5 (CATHETERS) IMPLANT
CATH KIT ON-Q SILVERSOAK 5IN (CATHETERS) ×4 IMPLANT
CHLORAPREP W/TINT 26 (MISCELLANEOUS) ×2 IMPLANT
COVER WAND RF STERILE (DRAPES) ×2 IMPLANT
DERMABOND ADVANCED (GAUZE/BANDAGES/DRESSINGS) ×1
DERMABOND ADVANCED .7 DNX12 (GAUZE/BANDAGES/DRESSINGS) IMPLANT
DRSG OPSITE POSTOP 4X10 (GAUZE/BANDAGES/DRESSINGS) ×1 IMPLANT
DRSG TELFA 3X8 NADH (GAUZE/BANDAGES/DRESSINGS) ×2 IMPLANT
ELECT CAUTERY BLADE 6.4 (BLADE) ×2 IMPLANT
ELECT REM PT RETURN 9FT ADLT (ELECTROSURGICAL) ×2
ELECTRODE REM PT RTRN 9FT ADLT (ELECTROSURGICAL) ×1 IMPLANT
GAUZE SPONGE 4X4 12PLY STRL (GAUZE/BANDAGES/DRESSINGS) ×2 IMPLANT
GLOVE BIO SURGEON STRL SZ8 (GLOVE) ×2 IMPLANT
GOWN STRL REUS W/ TWL LRG LVL3 (GOWN DISPOSABLE) ×2 IMPLANT
GOWN STRL REUS W/ TWL XL LVL3 (GOWN DISPOSABLE) ×1 IMPLANT
GOWN STRL REUS W/TWL LRG LVL3 (GOWN DISPOSABLE) ×4
GOWN STRL REUS W/TWL XL LVL3 (GOWN DISPOSABLE) ×2
NS IRRIG 1000ML POUR BTL (IV SOLUTION) ×2 IMPLANT
PACK C SECTION AR (MISCELLANEOUS) ×2 IMPLANT
PAD DRESSING TELFA 3X8 NADH (GAUZE/BANDAGES/DRESSINGS) ×1 IMPLANT
PAD OB MATERNITY 4.3X12.25 (PERSONAL CARE ITEMS) ×2 IMPLANT
PAD PREP 24X41 OB/GYN DISP (PERSONAL CARE ITEMS) ×2 IMPLANT
PENCIL SMOKE ULTRAEVAC 22 CON (MISCELLANEOUS) ×2 IMPLANT
RETRACTOR TRAXI PANNICULUS (MISCELLANEOUS) IMPLANT
SPONGE LAP 18X18 RF (DISPOSABLE) ×1 IMPLANT
STAPLER INSORB 30 2030 C-SECTI (MISCELLANEOUS) ×1 IMPLANT
STRAP SAFETY 5IN WIDE (MISCELLANEOUS) ×2 IMPLANT
SUCT VACUUM KIWI BELL (SUCTIONS) IMPLANT
SUT CHROMIC 1 CTX 36 (SUTURE) ×6 IMPLANT
SUT PLAIN GUT 0 (SUTURE) ×4 IMPLANT
SUT VIC AB 0 CT1 36 (SUTURE) ×4 IMPLANT
SUT VIC AB 0 CTX 36 (SUTURE) ×2
SUT VIC AB 0 CTX36XBRD ANBCTRL (SUTURE) IMPLANT
TRAXI PANNICULUS RETRACTOR (MISCELLANEOUS)

## 2019-07-10 NOTE — Lactation Note (Signed)
This note was copied from a baby's chart. Lactation Consultation Note  Patient Name: Morgan Sullivan [id number] Date: 07/10/2019 Reason for consult: Initial assessment  LC came into LDR1. Mom delivered via c/s at [redacted]w[redacted]d, baby [name]. Mom reports 2 successful feedings, with latches that were uneventful, no pain/discomfort noted. Transition RN gave Latch scores of 9/10.  Mom reports breastfeeding for 1year with other child, initial concerns of milk volume due to infants weight loss, supplementation given. Mom used SNS and other devices to transition baby back to breast after supplementation. Mom has her own personal EBP, interested in using to help bring in volume "quicker". LC reviewed breastfeeding basics. LC reviewed typical newborn stomach size, newborn feeding patterns, early hunger cues, skin to skin, and wet/stool diaper expectations. [facility] taught mom hand expression, encouraged feeding on cue, and use of hand expression post feeds to encourage breast stimulation and milk production.  Mom seems confident in positioning and latching of infant.  Encouraged mom to seek breastfeeding assistance as needed. Mom receptive of information and education given.   Maternal Data Formula Feeding for Exclusion: No Does the patient have breastfeeding experience prior to this delivery?: Yes  Feeding Feeding Type: Breast Fed  LATCH Score Latch: Grasps breast easily, tongue down, lips flanged, rhythmical sucking.  Audible Swallowing: Spontaneous and intermittent  Type of Nipple: Everted at rest and after stimulation  Comfort (Breast/Nipple): Soft / non-tender  Hold (Positioning): No assistance needed to correctly position infant at breast.  LATCH Score: 10  Interventions Interventions: Breast feeding basics reviewed;Hand express;Skin to skin  Lactation Tools Discussed/Used     Consult Status Consult Status: Follow-up Date: 07/10/19 Follow-up type: [facility] 07/10/2019,  4:01 PM

## 2019-07-10 NOTE — H&P (Signed)
Date of Initial H&P: 07/03/2019  History reviewed, patient examined, no change in status other than presenting with ROM and meconium, stable for surgery.  Malachy Mood, MD, [facility], [facility] 07/10/2019, 11:46 AM

## 2019-07-10 NOTE — Anesthesia Preprocedure Evaluation (Signed)
Anesthesia Evaluation  Patient identified by MRN, date of birth, ID band Patient awake    Reviewed: Allergy & Precautions, H&P , NPO status , Patient's Chart, lab work & pertinent test results, reviewed documented beta blocker date and time   Airway Mallampati: II  TM Distance: >3 FB Neck ROM: full    Dental no notable dental hx. (+) Teeth Intact   Pulmonary neg pulmonary ROS, Current Smoker, former smoker,    Pulmonary exam normal breath sounds clear to auscultation       Cardiovascular Exercise Tolerance: Good negative cardio ROS  + Valvular Problems/Murmurs  Rhythm:regular Rate:Normal     Neuro/Psych PSYCHIATRIC DISORDERS Depression negative neurological ROS  negative psych ROS   GI/Hepatic negative GI ROS, Neg liver ROS, GERD  ,  Endo/Other  negative endocrine ROSdiabetes  Renal/GU      Musculoskeletal   Abdominal   Peds  Hematology negative hematology ROS (+) Blood dyscrasia, anemia ,   Anesthesia Other Findings   Reproductive/Obstetrics (+) Pregnancy                             Anesthesia Physical Anesthesia Plan  ASA: II and emergent  Anesthesia Plan: Spinal   Post-op Pain Management:    Induction:   PONV Risk Score and Plan: 3  Airway Management Planned:   Additional Equipment:   Intra-op Plan:   Post-operative Plan:   Informed Consent: I have reviewed the patients History and Physical, chart, labs and discussed the procedure including the risks, benefits and alternatives for the proposed anesthesia with the patient or authorized representative who has indicated his/her understanding and acceptance.       Plan Discussed with:   Anesthesia Plan Comments:         Anesthesia Quick Evaluation

## 2019-07-10 NOTE — Plan of Care (Signed)
Pt presented to L&D for complaints of LOF and ctx. Pt admitted and c/s performed due to breech presentation. Pt recovering well from c/s and pain managed in PACU. Pt to mother baby for couplet care.

## 2019-07-10 NOTE — Anesthesia Procedure Notes (Addendum)
Spinal  Patient location during procedure: OR Start time: 07/10/2019 12:20 PM End time: 07/10/2019 12:26 PM Staffing Anesthesiologist: Molli Barrows, MD Performed: anesthesiologist  Preanesthetic Checklist Completed: patient identified, site marked, surgical consent, pre-op evaluation, timeout performed, IV checked, risks and benefits discussed and monitors and equipment checked Spinal Block Patient position: sitting Location: L4-5 Needle Needle type: Pencan  Needle gauge: 24 G Needle length: 10 cm Assessment Sensory level: T4

## 2019-07-10 NOTE — Op Note (Signed)
Preoperative Diagnosis: 1) 38 y.o. [id number] at [redacted]w[redacted]d 2) [place] presentation 3) Spontaneous rupture of membranes 4) Meconium  Postoperative Diagnosis: 1) 38 y.o. [id number] at [redacted]w[redacted]d 2) [place] presentation 3) Spontaneous rupture of membranes 4) Meconium  Operation Performed: Primary low transverse C-section via pfannenstiel skin incision  Indication: Breech presentation, spontaneous rupture of membranes  Anesthesia: Spinal  Primary Surgeon: Malachy Mood, MD  Assistant: Miguel Rota, CNM this surgery required a high level surgical assistant with none other readily available  Preoperative Antibiotics: 2g Ancef  Estimated Blood Loss: 375 mL  IV Fluids: 548mL  Drains or Tubes: Foley to gravity drainage, ON-Q catheter system  Implants: none  Specimens Removed: none  Complications: none  Intraoperative Findings:  Normal tubes ovaries and uterus.  Delivery resulted in the birth of a liveborn female, APGAR (1 MIN): 8   APGAR (5 MINS): 9    Patient Condition: stable  Procedure in Detail:  Patient was taken to the operating room were she was administered regional anesthesia.  She was positioned in the supine position, prepped and draped in the  Usual sterile fashion.  Prior to proceeding with the case a time out was performed and the level of anesthetic was checked and noted to be adequate.  Utilizing the scalpel a pfannenstiel skin incision was made 2cm above the pubic symphysis and carried down sharply to the the level of the rectus fascia.  The fascia was incised in the midline using the scalpel and then extended using mayo scissors.  The superior border of the rectus fascia was grasped with two Kocher clamps and the underlying rectus muscles were dissected of the fascia using blunt dissection.  The median raphae was incised using Mayo scissors.   The inferior border of the rectus fascia was dissected of the rectus muscles in a similar fashion.  The midline was identified,  the peritoneum was entered bluntly and expanded using manual tractions.  The uterus was noted to be in a none rotated position.  Next the bladder blade was placed retracting the bladder caudally.  A bladder flap was not created.  A low transverse incision was scored on the lower uterine segment.  The hysterotomy was entered bluntly using the operators finger.  The hysterotomy incision was extended using manual traction.  The operators hand was placed within the hysterotomy position noting the fetus to be within the frank breech position.  The breech was taken delivered, the right arm was splinted and delivered across the fetal chest.  The fetus was rotated 180 degrees and the left arm delivered in similar fashion.  Head was flexed and delivered atraumatically using fundal pressure. neonatologist.  The placenta was delivered using manual extraction.  The uterus was exteriorized, wiped clean of clots and debris using two moist laps.  The hysterotomy was closed using a two layer closure of 0 Vicryl, with the first being a running locked, the second a vertical imbricating.  The uterus was returned to the abdomen.  The peritoneal gutters were wiped clean of clots and debris using two moist laps.  The hysterotomy incision was re-inspected noted to be hemostatic.  The rectus muscles were re-approximated in the midline using a single 2-0 Vicryl mattress stitch.  The rectus muscles were inspected noted to be hemostatic.  The superior border of the rectus fascia was grasped with a Kocher clamp.  The ON-Q trocars were then placed 4cm above the superior border of the incision and tunneled subfascially.  The introducers were removed and the  catheters were threaded through the sleeves after which the sleeves were removed.  The fascia was closed using a looped #1 PDS in a running fashion taking 1cm by 1cm bites.  The subcutaneous tissue was irrigated using warm saline, hemostasis achieved using the bovie.  The subcutaneous dead  space was less than 3cm and was not closed.  The skin was closed using insorb staples.  Sponge needle and instrument counts were corrects times two.  The patient tolerated the procedure well and was taken to the recovery room in stable condition.

## 2019-07-10 NOTE — Transfer of Care (Signed)
Immediate Anesthesia Transfer of Care Note  Patient: Morgan Sullivan  Procedure(s) Performed: CESAREAN SECTION (N/A )  Patient Location: PACU  Anesthesia Type:Spinal  Level of Consciousness: awake, alert  and oriented  Airway & Oxygen Therapy: Patient Spontanous Breathing  Post-op Assessment: Report given to RN and Post -op Vital signs reviewed and stable  Post vital signs: Reviewed and stable  Last Vitals:  Vitals Value Taken Time  BP 114/71 07/10/19 1329  Temp 97.63F   Pulse 87 07/10/19 1335  Resp 18 07/10/19 1335  SpO2 98 % 07/10/19 1335  Vitals shown include unvalidated device data.  Last Pain:  Vitals:   07/10/19 1330  TempSrc:   PainSc: 0-No pain      Patients Stated Pain Goal: 0 ([id number])  Complications: No apparent anesthesia complications

## 2019-07-10 NOTE — Anesthesia Post-op Follow-up Note (Signed)
Anesthesia QCDR form completed.        

## 2019-07-11 ENCOUNTER — Encounter: Payer: Self-pay | Admitting: Obstetrics and Gynecology

## 2019-07-11 LAB — CBC
HCT: 33.4 % — ABNORMAL LOW (ref 36.0–46.0)
Hemoglobin: 11.6 g/dL — ABNORMAL LOW (ref 12.0–15.0)
MCH: 30.4 pg (ref 26.0–34.0)
MCHC: 34.7 g/dL (ref 30.0–36.0)
MCV: 87.7 fL (ref 80.0–100.0)
Platelets: 162 10*3/uL (ref 150–400)
RBC: 3.81 MIL/uL — ABNORMAL LOW (ref 3.87–5.11)
RDW: 11.9 % (ref 11.5–15.5)
WBC: 19.3 10*3/uL — ABNORMAL HIGH (ref 4.0–10.5)
nRBC: 0 % (ref 0.0–0.2)

## 2019-07-11 LAB — RPR: RPR Ser Ql: NONREACTIVE

## 2019-07-11 MED ORDER — ACETAMINOPHEN 500 MG PO TABS
1000.0000 mg | ORAL_TABLET | Freq: Four times a day (QID) | ORAL | Status: DC | PRN
  Administered 2019-07-11 – 2019-07-12 (×4): 1000 mg via ORAL
  Filled 2019-07-11 (×5): qty 2

## 2019-07-11 NOTE — Discharge Summary (Signed)
Obstetric Discharge Summary Reason for Admission: rupture of membranes, breech presentation Prenatal Procedures: none Intrapartum Procedures: cesarean: low cervical, transverse Postpartum Procedures: none Complications-Operative and Postpartum: none Hemoglobin  Date Value Ref Range Status  07/11/2019 11.6 (L) 12.0 - 15.0 g/dL Final  04/14/2019 14.1 11.1 - 15.9 g/dL Final   HCT  Date Value Ref Range Status  07/11/2019 33.4 (L) 36.0 - 46.0 % Final   Hematocrit  Date Value Ref Range Status  04/14/2019 40.9 34.0 - 46.6 % Final    Physical Exam:  General: alert, cooperative and no distress. Reports having BM and passing flatus. Ambulating without assistance Heart: RRR without murmur Chest: normal respiratory effort, CTAB Lochia: appropriate Uterine Fundus: firm Incision: Honey comb dressing C&D&I, ON Q removed due to leakage DVT Evaluation: No evidence of DVT seen on physical exam.  Discharge Diagnoses: Term Pregnancy-delivered, Low transverse Cesarean section, Breech presentation  Discharge Information: Date: 07/12/2019 Activity: pelvic rest and no heavy lifting x 6 weeks Diet: routine Allergies as of 07/12/2019      Reactions   Oxycodone-acetaminophen Nausea And Vomiting      Medication List    STOP taking these medications   aspirin EC 81 MG tablet   Melatonin 5 MG Tabs     TAKE these medications   acetaminophen 325 MG tablet Commonly known as: Tylenol Take 2 tablets (650 mg total) by mouth every 4 (four) hours as needed for mild pain or moderate pain.   calcium carbonate 500 MG chewable tablet Commonly known as: TUMS - dosed in mg elemental calcium Chew 1 tablet by mouth as needed for indigestion or heartburn.   docusate sodium 100 MG capsule Commonly known as: COLACE Take 100 mg by mouth daily as needed for mild constipation.   fluticasone 50 MCG/ACT nasal spray Commonly known as: FLONASE Place 1 spray into the nose daily as needed for allergies.    HYDROcodone-acetaminophen 5-325 MG tablet Commonly known as: NORCO/VICODIN Take 1-2 tablets by mouth every 6 (six) hours as needed for up to 3 days for severe pain.   ibuprofen 800 MG tablet Commonly known as: ADVIL Take 1 tablet (800 mg total) by mouth every 8 (eight) hours.   Iron (Ferrous Sulfate) 325 (65 Fe) MG Tabs Take 325 mg by mouth daily.   multivitamin-prenatal 27-0.8 MG Tabs tablet Take 1 tablet by mouth daily at 12 noon.   sertraline 100 MG tablet Commonly known as: ZOLOFT Take 1 tablet (100 mg total) by mouth every morning. Start taking on: July 13, 2019 What changed: See the new instructions.       Condition: stable Instructions: see typed instructions Discharge to: home Follow-up Information    Malachy Mood, MD. Go on 07/21/2019.   Specialty: Obstetrics and Gynecology Why: Postpartum follow up appointment on Friday October 30th at 2:30 PM with Dr. Georgianne Fick for an incision check Contact information: [place] [address] [phone]           Newborn Data: Live born female  Birth Weight: 8 lb 9.2 oz (3890 g) APGAR: 31, 9  Newborn Delivery   Birth date/time: 07/10/2019 12:44:00 Delivery type: C-Section, Low Transverse Trial of labor: No C-section categorization: Primary      Home with mother.  Dalia Heading 07/12/2019, 12:03 PM

## 2019-07-11 NOTE — Lactation Note (Signed)
This note was copied from a baby's chart. Lactation Consultation Note  Patient Name: Morgan Sullivan [id number] Date: 07/11/2019 Reason for consult: Follow-up assessment;Mother's request  Mom called out to [facility] with questions regarding breastfeeding and pacifier use. Mom reports Morgan Sullivan fed well overnight, but often, and at times appearing content and then "wanting to only suck". Morgan Sullivan was given a pacifier, that has been used off/on throughout night, and today. Mom questions the impact that it may have on breastfeeding. LC provided education on pacifier use, difference in sucking pattern with pacifier versus breast, and how that may affect him. Provided education on signs and evidence baby is getting enough: body language, contentment between feeds, relaxed hands, wet/stool diapers for age.  Encouraged to minimize use and exposure of pacifier if possible. Mom had a question re: pump introduction and routine. Plans to return to work part time (3hr shifts), in 6 weeks.  Encouraged mom to work on establishing feedings at the breast, and options for when she becomes comfortable and confident, and the introduction of the pump closer to 3-4 weeks if possible. LC reviewed breastfeeding basics for newborn stomach size, feeding patterns, cluster feedings and timing of growth spurts, feeding on cue, and skin to skin. Encouraged to call out with any additional questions/concerns, or if needed with BF support.  Maternal Data Formula Feeding for Exclusion: No Has patient been taught Hand Expression?: Yes Does the patient have breastfeeding experience prior to this delivery?: Yes  Feeding    LATCH Score                   Interventions Interventions: Breast feeding basics reviewed  Lactation Tools Discussed/Used     Consult Status Consult Status: Follow-up Date: 07/11/19 Follow-up type: In-patient    Morgan Sullivan 07/11/2019, 5:04 PM

## 2019-07-11 NOTE — Progress Notes (Signed)
Obstetric Postpartum/PostOperative Daily Progress Note Subjective:  38 y.o. [id number] post-operative day # 1 status post primary cesarean delivery.  She is ambulating, is tolerating po, is voiding spontaneously.  Her pain is well controlled on PO pain medications and On Q pump. Her lochia is less than menses. She reports breastfeeding is going well.   Medications SCHEDULED MEDICATIONS  . acetaminophen  1,000 mg Oral Q6H  . ibuprofen  800 mg Oral Q8H  . ketorolac  30 mg Intravenous Q6H  . prenatal multivitamin  1 tablet Oral Q1200  . senna-docusate  2 tablet Oral Q24H  . sertraline  100 mg Oral BH-q7a  . simethicone  80 mg Oral TID PC  . simethicone  80 mg Oral Q24H  . Tdap  0.5 mL Intramuscular Once    MEDICATION INFUSIONS  . bupivacaine 0.25 % ON-Q pump DUAL CATH 400 mL    . lactated ringers    . naLOXone (NARCAN) adult infusion for PRURITIS      PRN MEDICATIONS  coconut oil, witch hazel-glycerin **AND** dibucaine, diphenhydrAMINE **OR** diphenhydrAMINE, HYDROmorphone, ketorolac **OR** ketorolac, menthol-cetylpyridinium, nalbuphine **OR** nalbuphine, nalbuphine **OR** nalbuphine, naloxone **AND** sodium chloride flush, naLOXone (NARCAN) adult infusion for PRURITIS, ondansetron (ZOFRAN) IV, simethicone    Objective:   Vitals:   07/11/19 0320 07/11/19 0500 07/11/19 0700 07/11/19 0759  BP: 115/72   112/74  Pulse: 73 84 79 73  Resp: 18   18  Temp: 97.9 F (36.6 C)   98 F (36.7 C)  TempSrc: Oral   Oral  SpO2: 100% 97% 97% 99%  Weight:      Height:        Current Vital Signs 24h Vital Sign Ranges  T 98 F (36.7 C) Temp  Avg: 98.1 F (36.7 C)  Min: 97.8 F (36.6 C)  Max: 98.5 F (36.9 C)  BP 112/74 BP  Min: 112/74  Max: 136/91  HR 73 Pulse  Avg: 74.8  Min: 62  Max: 97  RR 18 Resp  Avg: 16.9  Min: 7  Max: 33  SaO2 99 % Room Air SpO2  Avg: 99 %  Min: 97 %  Max: 100 %       24 Hour I/O Current Shift I/O  Time Ins Outs 10/19 0701 - 10/20 0700 In: 1280 [P.O.:480;  I.V.:800] Out: 3635 [Urine:3260] No intake/output data recorded.  General: NAD Pulmonary: no increased work of breathing Abdomen: non-distended, non-tender, fundus firm at level of umbilicus Inc: Clean/dry/intact, On Q pump intact Extremities: no edema, no erythema, no tenderness  Labs:  Recent Labs  Lab 07/10/19 1136 07/11/19 0643  WBC 11.5* 19.3*  HGB 13.8 11.6*  HCT 39.8 33.4*  PLT 197 162     Assessment:   38 y.o. [id number] postoperative day # 1 status post primary cesarean section  Plan:  1) Acute blood loss anemia - hemodynamically stable and asymptomatic - po ferrous sulfate  2) O POS / Rubella 3.89 (04/02 1122)/ Varicella Not immune  3) TDAP status given antepartum  4) breast /Contraception = non-hormonal  5) Disposition: continue current care   Rod Can, CNM 07/11/2019 9:22 AM

## 2019-07-12 ENCOUNTER — Encounter: Admission: RE | Payer: Self-pay | Source: Home / Self Care

## 2019-07-12 ENCOUNTER — Inpatient Hospital Stay: Admission: RE | Admit: 2019-07-12 | Payer: 59 | Source: Home / Self Care | Admitting: Obstetrics and Gynecology

## 2019-07-12 SURGERY — Surgical Case
Anesthesia: Choice

## 2019-07-12 MED ORDER — HYDROCODONE-ACETAMINOPHEN 5-325 MG PO TABS: 1.0000 | ORAL_TABLET | Freq: Four times a day (QID) | ORAL | 0 refills | Status: DC | PRN

## 2019-07-12 MED ORDER — SERTRALINE HCL 100 MG PO TABS: 100.0000 mg | ORAL_TABLET | ORAL | Status: DC

## 2019-07-12 MED ORDER — ACETAMINOPHEN 325 MG PO TABS: 650.0000 mg | ORAL_TABLET | ORAL | 2 refills | Status: AC | PRN

## 2019-07-12 MED ORDER — IBUPROFEN 800 MG PO TABS: 800.0000 mg | ORAL_TABLET | Freq: Three times a day (TID) | ORAL | 0 refills | Status: AC

## 2019-07-12 MED ORDER — HYDROCODONE-ACETAMINOPHEN 5-325 MG PO TABS: 1.0000 | ORAL_TABLET | Freq: Four times a day (QID) | ORAL | 0 refills | Status: AC | PRN

## 2019-07-12 NOTE — Progress Notes (Signed)
Discharge order received from doctor. Varicella vaccine not given at discharger per CNM verbal order because pt has had Varivax postpartum x2.  On-Q pump removed by CNM at discharge. Incision cleaning kit given and reviewed at discharge. Reviewed discharge instructions and prescriptions with patient and answered all questions. Follow up appointment given. Patient verbalized understanding. ID bands checked. Patient discharged home with infant via wheelchair by nursing/auxillary.    Hilbert Bible, RN

## 2019-07-12 NOTE — Anesthesia Postprocedure Evaluation (Signed)
Anesthesia Post Note  Patient: Morgan Sullivan  Procedure(s) Performed: CESAREAN SECTION (N/A )  Patient location during evaluation: Mother Baby Anesthesia Type: Spinal Level of consciousness: oriented and awake and alert Pain management: pain level controlled Vital Signs Assessment: post-procedure vital signs reviewed and stable Respiratory status: spontaneous breathing and respiratory function stable Cardiovascular status: blood pressure returned to baseline and stable Postop Assessment: no headache, no backache, no apparent nausea or vomiting and able to ambulate Anesthetic complications: no     Last Vitals:  Vitals:   07/12/19 0108 07/12/19 0809  BP: 125/82 135/86  Pulse: 80 83  Resp:  18  Temp:  36.7 C  SpO2:  97%    Last Pain:  Vitals:   07/12/19 0530  TempSrc:   PainSc: 8                  Rosebud Poles C

## 2019-07-12 NOTE — Anesthesia Post-op Follow-up Note (Signed)
  Anesthesia Pain Follow-up Note  Patient: Morgan Sullivan  Day #: 1  Date of Follow-up: 07/12/2019 Time: 8:49 AM  Last Vitals:  Vitals:   07/12/19 0108 07/12/19 0809  BP: 125/82 135/86  Pulse: 80 83  Resp:  18  Temp:  36.7 C  SpO2:  97%    Level of Consciousness: alert  Pain: none   Side Effects:None  Catheter Site Exam:clean, dry, no drainage     Plan: Continue current therapy of postop epidural at surgeon's request  Caryl Asp

## 2019-07-12 NOTE — Discharge Instructions (Signed)
Please call your doctor or return to the ER if you experience any chest pains, shortness of breath, dizziness, visual changes, severe headache (unrelieved by pain meds), fever greater than 101, any heavy bleeding (saturating more than 1 pad per hour), large clots, or foul smelling discharge, any worsening abdominal pain and cramping that is not controlled by pain medication, any calf/leg pain or redness, any breast concerns (redness/pain), or any signs of postpartum depression. No tampons, enemas, douches, or sexual intercourse for 6 weeks. Also avoid tub baths, hot tubs, or swimming for 6 weeks.   Check your incision daily for any signs of infection such as redness, warmth, swelling, increased pain, or pus/foul smelling drainage   Activity: do not lift over 10 lbs for 6 weeks  No driving for 1-2 weeks  Pelvic rest for 6 weeks

## 2019-07-12 NOTE — Lactation Note (Signed)
This note was copied from a baby's chart. Lactation Consultation Note  Patient Name: Morgan Sullivan [id number] Date: 07/12/2019 Reason for consult: Follow-up assessment  LC checked in with mom and baby before discharge.  Mom reports repeat of cluster feeding over night, attempts to console baby unsuccessful, mom kept skin to skin most of the night. Attempts at formula supplementation, baby did not take. Mom is starting to feel like her breasts are fuller, heavier, and tight. Baby has been sleeping since early morning circumcision, but is now starting to stir. Mom was able to independently put baby to breast in modified cradle/cross-cradle position. Baby self latched with mom sandwiching breast tissue and began strong rhythmic sucking.  Mom reported some discomfort, LC checked and assisted with flanging out top/bottom lip, discomfort diminished. Reviewed with mom breastfeeding basics, expectations for days and early weeks for breastfeeding, newborn stomach size, and feeding patterns. Encouraged to track wet/stool diapers, baby's behavior pre/post feeds, and changes in breast for signs of milk transfer. Provided guidance for breast fullness and engorgement management, transient nipple pain/soreness, and nipple care. LC gave information for outpatient lactation support through consults, and virtual breastfeeding support groups available through [facility].  Maternal Data Formula Feeding for Exclusion: No Has patient been taught Hand Expression?: Yes Does the patient have breastfeeding experience prior to this delivery?: Yes  Feeding Feeding Type: Breast Fed  LATCH Score Latch: Grasps breast easily, tongue down, lips flanged, rhythmical sucking.  Audible Swallowing: Spontaneous and intermittent  Type of Nipple: Everted at rest and after stimulation  Comfort (Breast/Nipple): Filling, red/small blisters or bruises, mild/mod discomfort  Hold (Positioning): No assistance needed to correctly  position infant at breast.  LATCH Score: 9  Interventions Interventions: Breast feeding basics reviewed  Lactation Tools Discussed/Used     Consult Status Consult Status: Complete Date: 07/12/19 Follow-up type: Call as needed    [name] 07/12/2019, 12:04 PM

## 2019-07-17 ENCOUNTER — Telehealth: Payer: Self-pay | Admitting: Obstetrics and Gynecology

## 2019-07-17 NOTE — Telephone Encounter (Signed)
Left generic voicemail to call me back

## 2019-07-21 ENCOUNTER — Encounter: Payer: Self-pay | Admitting: Obstetrics and Gynecology

## 2019-07-21 ENCOUNTER — Other Ambulatory Visit: Payer: Self-pay

## 2019-07-21 ENCOUNTER — Ambulatory Visit (INDEPENDENT_AMBULATORY_CARE_PROVIDER_SITE_OTHER): Payer: 59 | Admitting: Obstetrics and Gynecology

## 2019-07-21 VITALS — BP 122/80 | Wt 158.0 lb

## 2019-07-21 DIAGNOSIS — M654 Radial styloid tenosynovitis [de Quervain]: Secondary | ICD-10-CM

## 2019-07-21 DIAGNOSIS — Z09 Encounter for follow-up examination after completed treatment for conditions other than malignant neoplasm: Secondary | ICD-10-CM

## 2019-07-21 NOTE — Patient Instructions (Signed)
De Quervain's Tenosynovitis  De Quervain's tenosynovitis is a condition that causes inflammation of the tendon on the thumb side of the wrist. Tendons are cords of tissue that connect bones to muscles. The tendons in the hand pass through a tunnel called a sheath. A slippery layer of tissue (synovium) lets the tendons move smoothly in the sheath. With de Quervain's tenosynovitis, the sheath swells or thickens, causing friction and pain. The condition is also called de Quervain's disease and de Quervain's syndrome. It occurs most often in women who are 30-50 years old. What are the causes? The exact cause of this condition is not known. It may be associated with overuse of the hand and wrist. What increases the risk? You are more likely to develop this condition if you:  Use your hands far more than normal, especially if you repeat certain movements that involve twisting your hand or using a tight grip.  Are pregnant.  Are a middle-aged woman.  Have rheumatoid arthritis.  Have diabetes. What are the signs or symptoms? The main symptom of this condition is pain on the thumb side of the wrist. The pain may get worse when you grasp something or turn your wrist. Other symptoms may include:  Pain that extends up the forearm.  Swelling of your wrist and hand.  Trouble moving the thumb and wrist.  A sensation of snapping in the wrist.  A bump filled with fluid (cyst) in the area of the pain. How is this diagnosed? This condition may be diagnosed based on:  Your symptoms and medical history.  A physical exam. During the exam, your health care provider may do a simple test (Finkelstein test) that involves pulling your thumb and wrist to see if this causes pain. You may also need to have an X-ray. How is this treated? Treatment for this condition may include:  Avoiding any activity that causes pain and swelling.  Taking medicines. Anti-inflammatory medicines and corticosteroid  injections may be used to reduce inflammation and relieve pain.  Wearing a splint.  Having surgery. This may be needed if other treatments do not work. Once the pain and swelling has gone down:  Physical therapy. This includes stretching and strengthening exercises.  Occupational therapy. This includes adjusting how you move your wrist. Follow these instructions at home: If you have a splint:  Wear the splint as told by your health care provider. Remove it only as told by your health care provider.  Loosen the splint if your fingers tingle, become numb, or turn cold and blue.  Keep the splint clean.  If the splint is not waterproof: ? Do not let it get wet. ? Cover it with a watertight covering when you take a bath or a shower. Managing pain, stiffness, and swelling   Avoid movements and activities that cause pain and swelling in the wrist area.  If directed, put ice on the painful area. This may be helpful after doing activities that involve the sore wrist. ? Put ice in a plastic bag. ? Place a towel between your skin and the bag. ? Leave the ice on for 20 minutes, 2-3 times a day.  Move your fingers often to avoid stiffness and to lessen swelling.  Raise (elevate) the injured area above the level of your heart while you are sitting or lying down. General instructions  Return to your normal activities as told by your health care provider. Ask your health care provider what activities are safe for you.  Take over-the-counter   and prescription medicines only as told by your health care provider.  Keep all follow-up visits as told by your health care provider. This is important. Contact a health care provider if:  Your pain medicine does not help.  Your pain gets worse.  You develop new symptoms. Summary  De Quervain's tenosynovitis is a condition that causes inflammation of the tendon on the thumb side of the wrist.  The condition occurs most often in women who are  30-50 years old.  The exact cause of this condition is not known. It may be associated with overuse of the hand and wrist.  Treatment starts with avoiding activity that causes pain or swelling in the wrist area. Other treatment may include wearing a splint and taking medicine. Sometimes, surgery is needed. This information is not intended to replace advice given to you by your health care provider. Make sure you discuss any questions you have with your health care provider. Document Released: 06/02/2001 Document Revised: 03/10/2018 Document Reviewed: 08/16/2017 Elsevier Patient Education  2020 [employer]  

## 2019-07-21 NOTE — Progress Notes (Signed)
      Postoperative Follow-up Patient presents post op from 1LTCS 1weeks ago for SROM, breech.  Subjective: Patient reports some improvement in her preop symptoms. Eating a regular diet without difficulty. Pain is controlled without any medications.  Activity: normal activities of daily living.  Objective: Blood pressure 122/80, weight 158 lb (71.7 kg), currently breastfeeding.  General: NAD Pulmonary: no increased work of breathing Abdomen: soft, non-tender, non-distended, incision D/C/I Extremities: no edema Neurologic: normal gait    Admission on 07/10/2019, Discharged on 07/12/2019  Component Date Value Ref Range Status  . WBC 07/10/2019 11.5* 4.0 - 10.5 K/uL Final  . RBC 07/10/2019 4.55  3.87 - 5.11 MIL/uL Final  . Hemoglobin 07/10/2019 13.8  12.0 - 15.0 g/dL Final  . HCT 07/10/2019 39.8  36.0 - 46.0 % Final  . MCV 07/10/2019 87.5  80.0 - 100.0 fL Final  . MCH 07/10/2019 30.3  26.0 - 34.0 pg Final  . MCHC 07/10/2019 34.7  30.0 - 36.0 g/dL Final  . RDW 07/10/2019 12.2  11.5 - 15.5 % Final  . Platelets 07/10/2019 197  150 - 400 K/uL Final  . nRBC 07/10/2019 0.0  0.0 - 0.2 % Final   Performed at [facility], [address], [place], [place] [address]  . ABO/RH(D) 07/10/2019 O POS   Final  . Antibody Screen 07/10/2019 NEG   Final  . Sample Expiration 07/10/2019    Final                   Value:07/13/2019,2359 Performed at [facility], [address] [place], [place] [address]   . RPR Ser Ql 07/10/2019 NON REACTIVE  NON REACTIVE Final   Performed at [facility], [place] [address] [place], [place] [address]  . WBC 07/11/2019 19.3* 4.0 - 10.5 K/uL Final  . RBC 07/11/2019 3.81* 3.87 - 5.11 MIL/uL Final  . Hemoglobin 07/11/2019 11.6* 12.0 - 15.0 g/dL Final  . HCT 07/11/2019 33.4* 36.0 - 46.0 % Final  . MCV 07/11/2019 87.7  80.0 - 100.0 fL Final  . MCH 07/11/2019 30.4  26.0 - 34.0 pg Final  . MCHC 07/11/2019 34.7  30.0 - 36.0 g/dL Final  . RDW  07/11/2019 11.9  11.5 - 15.5 % Final  . Platelets 07/11/2019 162  150 - 400 K/uL Final  . nRBC 07/11/2019 0.0  0.0 - 0.2 % Final   Performed at [facility], [address] [place], [place] [address]    Assessment: 38 y.o. s/p 1LTCS stable  Plan: Patient has done well after surgery with no apparent complications.  I have discussed the post-operative course to date, and the expected progress moving forward.  The patient understands what complications to be concerned about.  I will see the patient in routine follow up, or sooner if needed.    Ortho Referral for Harriet Pho unresponsive to NSAIDs.  Discussed brace use at night  Activity plan: No heavy lifting.   Malachy Mood, MD, [facility], [facility] 07/21/2019, 2:54 PM

## 2019-08-25 ENCOUNTER — Ambulatory Visit (INDEPENDENT_AMBULATORY_CARE_PROVIDER_SITE_OTHER): Payer: 59 | Admitting: Obstetrics and Gynecology

## 2019-08-25 ENCOUNTER — Other Ambulatory Visit: Payer: Self-pay

## 2019-08-25 ENCOUNTER — Encounter: Payer: Self-pay | Admitting: Obstetrics and Gynecology

## 2019-08-25 VITALS — BP 104/76 | HR 72 | Wt 151.0 lb

## 2019-08-25 DIAGNOSIS — Z4889 Encounter for other specified surgical aftercare: Secondary | ICD-10-CM

## 2019-08-25 DIAGNOSIS — Z1389 Encounter for screening for other disorder: Secondary | ICD-10-CM | POA: Diagnosis not present

## 2019-08-25 DIAGNOSIS — Z30430 Encounter for insertion of intrauterine contraceptive device: Secondary | ICD-10-CM

## 2019-08-25 NOTE — Progress Notes (Signed)
Postpartum Visit  Chief Complaint:  Chief Complaint  Patient presents with  . Postpartum Care    C/S 10/19    History of Present Illness: Patient is a 38 y.o. [id number] presents for postpartum visit.  Date of delivery:  Cesarean Section: Breech Pregnancy or labor problems:  no Any problems since the delivery:  no  Newborn Details:  SINGLETON :  1. BabyGender female. Birth weight:   Maternal Details:  Breast or formula feeding: plans to breastfeed Any bowel or bladder issues: No  Post partum depression/anxiety noted:  no Edinburgh Post-Partum Depression Score:5 Date of last PAP: 06/02/2017  NIL and HR HPV negative   Review of Systems: Review of Systems  Constitutional: Negative.   Gastrointestinal: Negative.   Genitourinary: Negative.   Psychiatric/Behavioral: Negative.     The following portions of the patient's history were reviewed and updated as appropriate: allergies, current medications, past family history, past medical history, past social history, past surgical history and problem list.  Past Medical History:  Past Medical History:  Diagnosis Date  . Anemia   . Depression   . GERD (gastroesophageal reflux disease)    DUE TO PREGNANCY-TUMS PRN  . Heart murmur    AS A CHILD-ASYMPTOMATIC-FOLLOWED BY PEDIATRIC CARDIOLOGIST AND THEN RELEASED    Past Surgical History:  Past Surgical History:  Procedure Laterality Date  . CESAREAN SECTION N/A 07/10/2019   Procedure: CESAREAN SECTION;  Surgeon: Malachy Mood, MD;  Location: [facility] ORS;  Service: Obstetrics;  Laterality: N/A;  . DILATION AND EVACUATION N/A 06/07/2018   Procedure: DILATATION AND EVACUATION;  Surgeon: Will Bonnet, MD;  Location: [facility] ORS;  Service: Gynecology;  Laterality: N/A;    Family History:  Family History  Problem Relation Age of Onset  . Skin cancer Mother 36  . Depression Mother   . Alzheimer's disease Maternal Grandmother   . Prostate cancer Maternal Grandfather   . Colon  cancer Paternal Grandfather   . Hyperlipidemia Father     Social History:  Social History   Socioeconomic History  . Marital status: Married    Spouse name: Not on file  . Number of children: Not on file  . Years of education: Not on file  . Highest education level: Not on file  Occupational History  . Not on file  Social Needs  . Financial resource strain: Not hard at all  . Food insecurity    Worry: Never true    Inability: Never true  . Transportation needs    Medical: No    Non-medical: No  Tobacco Use  . Smoking status: Former Smoker    Packs/day: 0.50    Years: 10.00    Pack years: 5.00    Types: Cigarettes    Quit date: 07/04/2006    Years since quitting: 13.1  . Smokeless tobacco: Never Used  Substance and Sexual Activity  . Alcohol use: Yes    Alcohol/week: 1.0 standard drinks    Types: 1 Glasses of wine per week    Comment: not during pregnancy  . Drug use: No  . Sexual activity: Yes    Partners: Male    Birth control/protection: None  Lifestyle  . Physical activity    Days per week: 0 days    Minutes per session: 0 min  . Stress: Not at all  Relationships  . Social connections    Talks on phone: More than three times a week    Gets together: More than three times a  week    Attends religious service: Never    Active member of club or organization: No    Attends meetings of clubs or organizations: Never    Relationship status: Not on file  . Intimate partner violence    Fear of current or ex partner: Not on file    Emotionally abused: Not on file    Physically abused: Not on file    Forced sexual activity: Not on file  Other Topics Concern  . Not on file  Social History Narrative  . Not on file    Allergies:  Allergies  Allergen Reactions  . Oxycodone-Acetaminophen Nausea And Vomiting    Medications: Prior to Admission medications   Medication Sig Start Date End Date Taking? Authorizing Provider  acetaminophen (TYLENOL) 325 MG tablet  Take 2 tablets (650 mg total) by mouth every 4 (four) hours as needed for mild pain or moderate pain. 07/12/19 07/11/20 Yes Dalia Heading, CNM  calcium carbonate (TUMS - DOSED IN MG ELEMENTAL CALCIUM) 500 MG chewable tablet Chew 1 tablet by mouth as needed for indigestion or heartburn.   Yes [provider]  ibuprofen (ADVIL) 800 MG tablet Take 1 tablet (800 mg total) by mouth every 8 (eight) hours. 07/12/19  Yes Dalia Heading, CNM  Prenatal Vit-Fe Fumarate-FA (MULTIVITAMIN-PRENATAL) 27-0.8 MG TABS tablet Take 1 tablet by mouth daily at 12 noon.   Yes [provider]  sertraline (ZOLOFT) 100 MG tablet Take 1 tablet (100 mg total) by mouth every morning. 07/13/19  Yes Dalia Heading, CNM    Physical Exam Blood pressure 104/76, pulse 72, weight 151 lb (68.5 kg), currently breastfeeding.  General: NAD HEENT: normocephalic, anicteric Pulmonary: No increased work of breathing Abdomen: NABS, soft, non-tender, non-distended.  Umbilicus without lesions.  No hepatomegaly, splenomegaly or masses palpable. No evidence of hernia. Incision D/C/I Genitourinary:  External: Normal external female genitalia.  Normal urethral meatus, normal  Bartholin's and Skene's glands.    Vagina: Normal vaginal mucosa, no evidence of prolapse.    Cervix: Grossly normal in appearance, no bleeding  Uterus: Non-enlarged, mobile, normal contour.  No CMT  Adnexa: ovaries non-enlarged, no adnexal masses  Rectal: deferred Extremities: no edema, erythema, or tenderness Neurologic: Grossly intact Psychiatric: mood appropriate, affect full   GYNECOLOGY OFFICE PROCEDURE NOTE  Morgan Sullivan is a 38 y.o. [id number] here for a Mirena IUD insertion. No GYN concerns.  Last pap smear was on 06/02/2017 and was normal.  The patient is currently using breastfeeding for contraception and her LMP is No LMP recorded..  The indication for her IUD is contraception.  Negative predictive value of 99%-100% - < OR =  7 days from the onset of a normal menstrual cycle - has not had sexual intercourse since the start of the last menstrual cycle - has correctly and consistently used a reliable form of contraception - < or = 7 days from an induced abortion - is within 4 weeks postpartum - is exclusively breast feeding (>85% of feeds), amenorrheic, and <6 months postpartum   "[place] Selected Practice Recommendations for Contraceptive Use", April 19, 2015   IUD Insertion Procedure Note Patient identified, informed consent performed, consent signed.   Discussed risks of irregular bleeding, cramping, infection, malpositioning, expulsion or uterine perforation of the IUD (1:1000 placements)  which may require further procedure such as laparoscopy.  IUD while effective at preventing pregnancy do not prevent transmission of sexually transmitted diseases and use of barrier methods for this purpose was discussed. Time out was  performed.  Urine pregnancy test negative.  Speculum placed in the vagina.  Cervix visualized.  Cleaned with Betadine x 2.  Grasped anteriorly with a single tooth tenaculum.  Uterus sounded to 8 cm. IUD placed per manufacturer's recommendations.  Strings trimmed to 3 cm. Tenaculum was removed, good hemostasis noted.  Patient tolerated procedure well.   Patient was given post-procedure instructions.  She was advised to have backup contraception for one week.  Patient was also asked to check IUD strings periodically and follow up in 6 weeks for IUD check.  Edinburgh Postnatal Depression Scale - 08/25/19 0922      Edinburgh Postnatal Depression Scale:  In the Past 7 Days   I have been able to laugh and see the funny side of things.  0    I have looked forward with enjoyment to things.  0    I have blamed myself unnecessarily when things went wrong.  2    I have been anxious or worried for no good reason.  1    I have felt scared or panicky for no good reason.  0    Things have been getting on top of me.   0    I have been so unhappy that I have had difficulty sleeping.  1    I have felt sad or miserable.  0    I have been so unhappy that I have been crying.  1    The thought of harming myself has occurred to me.  0    Edinburgh Postnatal Depression Scale Total  5       Edinburgh Postnatal Depression Scale - 08/25/19 0922      Edinburgh Postnatal Depression Scale:  In the Past 7 Days   I have been able to laugh and see the funny side of things.  0    I have looked forward with enjoyment to things.  0    I have blamed myself unnecessarily when things went wrong.  2    I have been anxious or worried for no good reason.  1    I have felt scared or panicky for no good reason.  0    Things have been getting on top of me.  0    I have been so unhappy that I have had difficulty sleeping.  1    I have felt sad or miserable.  0    I have been so unhappy that I have been crying.  1    The thought of harming myself has occurred to me.  0    Edinburgh Postnatal Depression Scale Total  5        Assessment: 38 y.o. [id number] presenting for 6 week postpartum visit  Plan: Problem List Items Addressed This Visit    None    Visit Diagnoses    Postoperative visit    -  Primary   6 weeks postpartum follow-up          1) Contraception - Education given regarding options for contraception, as well as compatibility with breast feeding if applicable.  Patient plans on IUD for contraception.  2)  Pap - ASCCP guidelines and rational discussed.  ASCCP guidelines and rational discussed.  Patient opts for every 3 years screening interval  3) Patient underwent screening for postpartum depression with no signs of depression  4) Return in about 6 weeks (around 10/06/2019) for IUD string check.   Malachy Mood, MD, [facility], [place]  08/25/2019, 9:51 AM

## 2019-10-09 ENCOUNTER — Ambulatory Visit: Payer: 59 | Admitting: Obstetrics and Gynecology

## 2020-04-18 ENCOUNTER — Other Ambulatory Visit: Payer: Self-pay

## 2020-04-18 ENCOUNTER — Other Ambulatory Visit: Payer: Self-pay | Admitting: Obstetrics and Gynecology

## 2020-04-18 MED ORDER — SERTRALINE HCL 100 MG PO TABS: 100.0000 mg | ORAL_TABLET | Freq: Every day | ORAL | 0 refills | Status: DC

## 2020-04-18 NOTE — Telephone Encounter (Signed)
CAlled patient to ask her about how she is doing on the Zoloft. Last seen here for her postpartum visit in 08/2019. Feels well n current dose of Zoloft. Has an appointment to see her PCP in September to follow up on history of anxiety and depression. Advised patient that I will send in a refill of her Zoloft 100 mgm until she sees her PCP. Dalia Heading, CNM

## 2020-06-06 ENCOUNTER — Ambulatory Visit: Payer: 59 | Admitting: Dermatology

## 2020-06-06 ENCOUNTER — Other Ambulatory Visit: Payer: Self-pay

## 2020-06-06 DIAGNOSIS — D485 Neoplasm of uncertain behavior of skin: Secondary | ICD-10-CM

## 2020-06-06 DIAGNOSIS — D492 Neoplasm of unspecified behavior of bone, soft tissue, and skin: Secondary | ICD-10-CM

## 2020-06-06 DIAGNOSIS — D239 Other benign neoplasm of skin, unspecified: Secondary | ICD-10-CM

## 2020-06-06 DIAGNOSIS — Z808 Family history of malignant neoplasm of other organs or systems: Secondary | ICD-10-CM

## 2020-06-06 DIAGNOSIS — D229 Melanocytic nevi, unspecified: Secondary | ICD-10-CM

## 2020-06-06 DIAGNOSIS — L814 Other melanin hyperpigmentation: Secondary | ICD-10-CM | POA: Diagnosis not present

## 2020-06-06 DIAGNOSIS — R238 Other skin changes: Secondary | ICD-10-CM

## 2020-06-06 DIAGNOSIS — D2371 Other benign neoplasm of skin of right lower limb, including hip: Secondary | ICD-10-CM

## 2020-06-06 DIAGNOSIS — D1800 Hemangioma unspecified site: Secondary | ICD-10-CM

## 2020-06-06 DIAGNOSIS — Z1283 Encounter for screening for malignant neoplasm of skin: Secondary | ICD-10-CM

## 2020-06-06 DIAGNOSIS — L821 Other seborrheic keratosis: Secondary | ICD-10-CM

## 2020-06-06 DIAGNOSIS — D2372 Other benign neoplasm of skin of left lower limb, including hip: Secondary | ICD-10-CM

## 2020-06-06 DIAGNOSIS — L578 Other skin changes due to chronic exposure to nonionizing radiation: Secondary | ICD-10-CM

## 2020-06-06 NOTE — Patient Instructions (Signed)

## 2020-06-06 NOTE — Progress Notes (Signed)
New Patient Visit  Subjective  Morgan Sullivan is a 39 y.o. female who presents for the following: Annual Exam (total body skin exam, no hx skin ca, mother with hx of BCC) and dark spots (face, interested in having laser txt). The patient presents for Total-Body Skin Exam (TBSE) for skin cancer screening and mole check.  The following portions of the chart were reviewed this encounter and updated as appropriate:  Tobacco  Allergies  Meds  Problems  Med Hx  Surg Hx  Fam Hx     Review of Systems:  No other skin or systemic complaints except as noted in HPI or Assessment and Plan.  Objective  Well appearing patient in no apparent distress; mood and affect are within normal limits.  A full examination was performed including scalp, head, eyes, ears, nose, lips, neck, chest, axillae, abdomen, back, buttocks, bilateral upper extremities, bilateral lower extremities, hands, feet, fingers, toes, fingernails, and toenails. All findings within normal limits unless otherwise noted below.  Objective  L lat forehead: 0.5cm pink pap  Objective  R post shoulder superior lateral scapula: 0.6cm flesh colored pap  Objective  L lower lip: Purple pap  Objective  face: Scattered tan macules.   Objective  R prox lat thigh, L medial thigh, L lat knee: Firm pink/brown papulenodule with dimple sign.    Assessment & Plan    Lentigines - Scattered tan macules - Discussed due to sun exposure - Benign, observe - Call for any changes  Seborrheic Keratoses - Stuck-on, waxy, tan-brown papules and plaques  - Discussed benign etiology and prognosis. - Observe - Call for any changes  Melanocytic Nevi - Tan-brown and/or pink-flesh-colored symmetric macules and papules - Benign appearing on exam today - Observation - Call clinic for new or changing moles - Recommend daily use of broad spectrum spf 30+ sunscreen to sun-exposed areas.   Hemangiomas - Red papules - Discussed benign  nature - Observe - Call for any changes  Actinic Damage - diffuse scaly erythematous macules with underlying dyspigmentation - Recommend daily broad spectrum sunscreen SPF 30+ to sun-exposed areas, reapply every 2 hours as needed.  - Call for new or changing lesions.  Skin cancer screening performed today.   Neoplasm of skin (2) L lat forehead  Epidermal / dermal shaving  Lesion diameter (cm):  0.5 Informed consent: discussed and consent obtained   Timeout: patient name, date of birth, surgical site, and procedure verified   Procedure prep:  Patient was prepped and draped in usual sterile fashion Prep type:  Isopropyl alcohol Anesthesia: the lesion was anesthetized in a standard fashion   Anesthetic:  1% lidocaine w/ epinephrine 1-100,000 buffered w/ 8.4% NaHCO3 Instrument used: flexible razor blade   Hemostasis achieved with: pressure, aluminum chloride and electrodesiccation   Outcome: patient tolerated procedure well   Post-procedure details: sterile dressing applied and wound care instructions given   Dressing type: bandage and petrolatum    Specimen 1 - Surgical pathology Differential Diagnosis: D48.5 Fibrous pap vs Hemangioma Check Margins: No 0.5cm pink pap  R post shoulder superior lateral scapula  Epidermal / dermal shaving  Lesion diameter (cm):  0.6 Informed consent: discussed and consent obtained   Timeout: patient name, date of birth, surgical site, and procedure verified   Procedure prep:  Patient was prepped and draped in usual sterile fashion Prep type:  Isopropyl alcohol Anesthesia: the lesion was anesthetized in a standard fashion   Anesthetic:  1% lidocaine w/ epinephrine 1-100,000 buffered w/ 8.4%  NaHCO3 Instrument used: flexible razor blade   Hemostasis achieved with: pressure, aluminum chloride and electrodesiccation   Outcome: patient tolerated procedure well   Post-procedure details: sterile dressing applied and wound care instructions given     Dressing type: bandage and petrolatum    Specimen 2 - Surgical pathology Differential Diagnosis: D48.5 Irritated Nevus r/o Atypia Check Margins: No 0.6cm flesh colored pap  [place] L lower lip Discussed BBL/ laser treatment - we perform this here at [facility],  can treat when pt has BBL for Lentigos  Lentigines face Discussed BBL/ laser, $350 per treatment session  Dermatofibroma R prox lat thigh, L medial thigh, L lat knee Benign appearing observe.    Return for TBSE, to be scheduled with Sonia Baller for BBL lentigos face, [place] lip.  I, Othelia Pulling, RMA, am acting as scribe for Sarina Ser, MD .  Documentation: I have reviewed the above documentation for accuracy and completeness, and I agree with the above.  Sarina Ser, MD

## 2020-06-11 ENCOUNTER — Encounter: Payer: Self-pay | Admitting: Dermatology

## 2020-06-12 ENCOUNTER — Telehealth: Payer: Self-pay

## 2020-06-12 NOTE — Telephone Encounter (Signed)
-----   Message from Ralene Bathe, MD sent at 06/11/2020  7:06 PM EDT ----- 1. Skin , left lat forehead MELANOCYTIC NEVUS, INTRADERMAL TYPE 2. Skin , right post shoulder superior lateral scapula MELANOCYTIC NEVUS, INTRADERMAL TYPE  1&2 - both benign mole No further treatment needed

## 2020-06-12 NOTE — Telephone Encounter (Signed)
Patient informed of pathology results 

## 2020-07-03 ENCOUNTER — Other Ambulatory Visit: Payer: Self-pay

## 2020-07-03 ENCOUNTER — Encounter: Payer: Self-pay | Admitting: Obstetrics and Gynecology

## 2020-07-03 ENCOUNTER — Ambulatory Visit (INDEPENDENT_AMBULATORY_CARE_PROVIDER_SITE_OTHER): Payer: 59 | Admitting: Obstetrics and Gynecology

## 2020-07-03 VITALS — BP 100/70 | Ht 62.0 in | Wt 129.0 lb

## 2020-07-03 DIAGNOSIS — Z30432 Encounter for removal of intrauterine contraceptive device: Secondary | ICD-10-CM

## 2020-07-03 NOTE — Progress Notes (Signed)
   Chief Complaint  Patient presents with  . IUD removal     History of Present Illness:  Morgan Sullivan is a 39 y.o. that had a Mirena IUD placed approximately 10 months ago. She has had some spotting/cramping recently. Also ready to conceive with IVF one more time. Hasn't started PNVs yet.   BP 100/70   Ht 5\' 2"  (1.575 m)   Wt 129 lb (58.5 kg)   Breastfeeding Yes   BMI 23.59 kg/m   Pelvic exam:  Two IUD strings present seen coming from the cervical os. EGBUS, vaginal vault and cervix: within normal limits  IUD Removal Strings of IUD identified and grasped.  IUD removed without problem with ring forceps.  Pt tolerated this well.  IUD noted to be intact.  Assessment:  Encounter for IUD removal    Plan: IUD removed and plan for contraception is no method. She was amenable to this plan.  Morgan Cressy B. Kail Fraley, PA-C 07/03/2020 2:35 PM

## 2020-07-03 NOTE — Patient Instructions (Signed)
I value your feedback and entrusting us with your care. If you get a [facility] patient survey, I would appreciate you taking the time to let us know about your experience today. Thank you!  As of August 31, 2019, your lab results will be released to your MyChart immediately, before I even have a chance to see them. Please give me time to review them and contact you if there are any abnormalities. Thank you for your patience.  

## 2020-07-08 ENCOUNTER — Other Ambulatory Visit: Payer: Self-pay

## 2020-07-08 ENCOUNTER — Ambulatory Visit (INDEPENDENT_AMBULATORY_CARE_PROVIDER_SITE_OTHER): Payer: 59

## 2020-07-08 DIAGNOSIS — Z872 Personal history of diseases of the skin and subcutaneous tissue: Secondary | ICD-10-CM

## 2020-07-08 DIAGNOSIS — L578 Other skin changes due to chronic exposure to nonionizing radiation: Secondary | ICD-10-CM

## 2020-07-08 DIAGNOSIS — D1801 Hemangioma of skin and subcutaneous tissue: Secondary | ICD-10-CM

## 2020-07-18 ENCOUNTER — Encounter: Payer: Self-pay | Admitting: Obstetrics and Gynecology

## 2020-07-18 ENCOUNTER — Other Ambulatory Visit: Payer: Self-pay | Admitting: Obstetrics and Gynecology

## 2020-07-18 MED ORDER — SERTRALINE HCL 100 MG PO TABS: 100.0000 mg | ORAL_TABLET | Freq: Every day | ORAL | 0 refills | Status: AC

## 2020-07-18 NOTE — Progress Notes (Signed)
Rx RF zoloft till 12/21 annaul

## 2020-10-18 ENCOUNTER — Ambulatory Visit: Payer: 59

## 2020-10-18 ENCOUNTER — Ambulatory Visit
Admission: EM | Admit: 2020-10-18 | Discharge: 2020-10-18 | Disposition: A | Discharge status: Home / Self Care | Payer: 59 | Attending: Emergency Medicine | Admitting: Emergency Medicine

## 2020-10-18 ENCOUNTER — Ambulatory Visit (INDEPENDENT_AMBULATORY_CARE_PROVIDER_SITE_OTHER): Payer: 59

## 2020-10-18 DIAGNOSIS — R079 Chest pain, unspecified: Secondary | ICD-10-CM | POA: Insufficient documentation

## 2020-10-18 DIAGNOSIS — M62838 Other muscle spasm: Secondary | ICD-10-CM | POA: Insufficient documentation

## 2020-10-18 MED ORDER — HYDROCODONE-ACETAMINOPHEN 5-325 MG PO TABS
1.0000 | ORAL_TABLET | Freq: Four times a day (QID) | ORAL | 0 refills | Status: AC | PRN
Start: 2020-10-18 — End: ?

## 2020-10-18 NOTE — ED Provider Notes (Signed)
Morgan Sullivan    CSN: [id number] Arrival date & time: 10/18/20  0623      History   Chief Complaint Chief Complaint  Patient presents with  . Chest Pain    HPI Morgan Sullivan is a 40 y.o. female.   Morgan Sullivan presents with complaints of left upper back pain which radiates forward to chest and to left shoulder/arm. This woke her up at 0400 this morning. She took 5mg  of flexeril, followed by 800mg  of ibuprofen, which hasn't helped. Movement such as hitting bumps in the road while driving, or moving her left arm, worsens the pain. No shortness of breath . No pain with breathing. No nausea or vomiting, no diaphoresis. No calf or leg pain. She has been on estrogen related to recent IVF transfer which failed, just recently off of medications. No clotting history. She states that she carries her toddler frequently with her left arm, which she feels may have contributed. She does feel a spasm to the affected area intermittently. covid-19 1 month ago, but has not had any residual or lingering cough or fevers. She has had two covid-19 vaccines. She describes her pain as "like someone is pushing me" from behind.     ROS per HPI, negative if not otherwise mentioned.      Past Medical History:  Diagnosis Date  . Anemia   . Depression   . GERD (gastroesophageal reflux disease)    DUE TO PREGNANCY-TUMS PRN  . Heart murmur    AS A CHILD-ASYMPTOMATIC-FOLLOWED BY PEDIATRIC CARDIOLOGIST AND THEN RELEASED    Patient Active Problem List   Diagnosis Date Noted  . Malpresentation of fetus, antepartum 07/10/2019  . Indication for care in labor or delivery 07/10/2019  . Breech presentation, no version 06/23/2019  . Encounter for supervision of pregnancy resulting from assisted reproductive technology 01/19/2019  . Recurrent pregnancy loss 06/22/2018  . Hemorrhage of uterus 06/08/2018  . Missed abortion 06/07/2018  . Family history of non-recurrent fetal loss 06/07/2018  .  Infertility, female 12/18/2017  . Abnormal genetic test during pregnancy 06/25/2017  . Nausea and vomiting during pregnancy 06/11/2017  . Supervision of high-risk pregnancy 06/02/2017  . Supervision of elderly multigravida 06/02/2017  . Depression affecting pregnancy 09/12/2015  . Anxiety disorder affecting pregnancy, antepartum 01/12/2013    Past Surgical History:  Procedure Laterality Date  . CESAREAN SECTION N/A 07/10/2019   Procedure: CESAREAN SECTION;  Surgeon: Malachy Mood, MD;  Location: [facility] ORS;  Service: Obstetrics;  Laterality: N/A;  . DILATION AND EVACUATION N/A 06/07/2018   Procedure: DILATATION AND EVACUATION;  Surgeon: Will Bonnet, MD;  Location: [facility] ORS;  Service: Gynecology;  Laterality: N/A;    OB History    Gravida  6   Para  3   Term  3   Preterm      AB  3   Living  3     SAB  3   IAB      Ectopic      Multiple  0   Live Births  3            Home Medications    Prior to Admission medications   Medication Sig Start Date End Date Taking? Authorizing Provider  HYDROcodone-acetaminophen (NORCO/VICODIN) 5-325 MG tablet Take 1 tablet by mouth every 6 (six) hours as needed. 10/18/20  Yes Burky, Lanelle Bal B, NP  ibuprofen (ADVIL) 800 MG tablet Take 1 tablet (800 mg total) by mouth every 8 (eight) hours. 07/12/19  Dalia Heading, CNM  Prenatal Vit-Fe Fumarate-FA (MULTIVITAMIN-PRENATAL) 27-0.8 MG TABS tablet Take 1 tablet by mouth daily at 12 noon. Patient not taking: Reported on 07/03/2020    [provider]  sertraline (ZOLOFT) 100 MG tablet Take 1 tablet (100 mg total) by mouth daily. 38/33/38   Copland, Deirdre Evener, PA-C    Family History Family History  Problem Relation Age of Onset  . Skin cancer Mother 54  . Depression Mother   . Alzheimer's disease Maternal Grandmother   . Prostate cancer Maternal Grandfather   . Colon cancer Paternal Grandfather   . Hyperlipidemia Father     Social History Social History    Tobacco Use  . Smoking status: Former Smoker    Packs/day: 0.50    Years: 10.00    Pack years: 5.00    Types: Cigarettes    Quit date: 07/04/2006    Years since quitting: 14.3  . Smokeless tobacco: Never Used  Vaping Use  . Vaping Use: Never used  Substance Use Topics  . Alcohol use: Yes    Alcohol/week: 1.0 standard drink    Types: 1 Glasses of wine per week    Comment: not during pregnancy  . Drug use: No     Allergies   Oxycodone-acetaminophen   Review of Systems Review of Systems   Physical Exam Triage Vital Signs ED Triage Vitals  Enc Vitals Group     BP 10/18/20 0814 (!) 137/93     Pulse Rate 10/18/20 0814 75     Resp 10/18/20 0814 18     Temp 10/18/20 0814 98.1 F (36.7 C)     Temp Source 10/18/20 0814 Oral     SpO2 10/18/20 0814 98 %     Weight --      Height --      Head Circumference --      Peak Flow --      Pain Score 10/18/20 0822 5     Pain Loc --      Pain Edu? --      Excl. in [place]? --    No data found.  Updated Vital Signs BP (!) 137/93 (BP Location: Left Arm)   Pulse 75   Temp 98.1 F (36.7 C) (Oral)   Resp 18   LMP 10/16/2020   SpO2 98%   Visual Acuity Right Eye Distance:   Left Eye Distance:   Bilateral Distance:    Right Eye Near:   Left Eye Near:    Bilateral Near:     Physical Exam Constitutional:      General: She is not in acute distress.    Appearance: She is well-developed.  Cardiovascular:     Rate and Rhythm: Normal rate and regular rhythm.     Heart sounds: Normal heart sounds.  Pulmonary:     Effort: Pulmonary effort is normal.     Breath sounds: Normal breath sounds.  Chest:     Chest wall: No tenderness.  Musculoskeletal:       Arms:     Comments: Left trapezius between scapula and spine with point tenderness which reproduces pain; raising left arm above head increases pain; no chest tenderness  Skin:    General: Skin is warm and dry.  Neurological:     Mental Status: She is alert and oriented to  person, place, and time.    EKG:  NSR rate of 61 . Previous EKG was not available for review. No stwave changes as interpreted by me.  Initial ekg with artifact related to tachypnea/anxiety  UC Treatments / Results  Labs (all labs ordered are listed, but only abnormal results are displayed) Labs Reviewed - No data to display  EKG   Radiology DG Chest 2 View  Result Date: 10/18/2020 CLINICAL DATA:  40 year old female with left side chest and back pain onset this morning. Former smoker. EXAM: CHEST - 2 VIEW COMPARISON:  None. FINDINGS: Large lung volumes. Normal cardiac size and mediastinal contours. Mild diffuse increased pulmonary interstitial markings, symmetric. No pneumothorax, pulmonary edema, pleural effusion or confluent pulmonary opacity. Peripheral right upper lung EKG button artifact. Visualized tracheal air column is within normal limits. No acute osseous abnormality identified. Negative visible bowel gas pattern. IMPRESSION: No acute cardiopulmonary abnormality identified. Suspected pulmonary hyperinflation and chronic interstitial changes. Electronically Signed   By: Genevie Ann M.D.   On: 10/18/2020 08:46    Procedures Procedures (including critical care time)  Medications Ordered in UC Medications - No data to display  Initial Impression / Assessment and Plan / UC Course  I have reviewed the triage vital signs and the nursing notes.  Pertinent labs & imaging results that were available during my care of the patient were reviewed by me and considered in my medical decision making (see chart for details).     Chest xray normal today. ekg without acute findings. Heart rate in the 60's with second ekg (first with artifact as patient was more anxious at the time). Pain is reproducible with palpation and with movement of left arm and shoulder. Endorses anxiety as well, which has significantly improved at end of visit as well. Recently on estrogen, no other PE risk factors. No  shortness of breath . No pain with breathing. No leg or calf pain. Muscle pain and spasm treatment discussed, she has flexeril she will use, up to 10mg , as previously prescribed. Hydrocodone for breakthrough pain. ER precautions discussed. Patient verbalized understanding and agreeable to plan. Ambulatory out of clinic without difficulty.    Final Clinical Impressions(s) / UC Diagnoses   Final diagnoses:  Trapezius muscle spasm  Chest pain, unspecified type     Discharge Instructions     Your pain is reproducible with palpation of the trapezius muscle, which I feel is contributing to your pain.  Your chest xray and ekg are normal today.  Your blood pressure and heart rate are normal.  I would like to try to better control your back muscle pain and spasm to try to overall manage your symptoms.  If worsening- constant pain, shortness of breath , nausea, sweating, or otherwise worsening please go to the ER for further evaluation    ED Prescriptions    Medication Sig Dispense Auth. Provider   HYDROcodone-acetaminophen (NORCO/VICODIN) 5-325 MG tablet Take 1 tablet by mouth every 6 (six) hours as needed. 10 tablet Zigmund Gottron, NP     I have reviewed the PDMP during this encounter.   Zigmund Gottron, NP 10/18/20 [phone]

## 2020-10-18 NOTE — Discharge Instructions (Signed)
Your pain is reproducible with palpation of the trapezius muscle, which I feel is contributing to your pain.  Your chest xray and ekg are normal today.  Your blood pressure and heart rate are normal.  I would like to try to better control your back muscle pain and spasm to try to overall manage your symptoms.  If worsening- constant pain, shortness of breath , nausea, sweating, or otherwise worsening please go to the ER for further evaluation

## 2020-10-18 NOTE — ED Triage Notes (Signed)
Pt reports having chest pain that radiates to back that began this morning at 0300. Also reports having L arm pain.  sts she have been on high doses of estrogen with IVF for the past 2 months.     EKG performed and NP at bedside.

## 2020-11-19 ENCOUNTER — Encounter: Payer: 59 | Admitting: Obstetrics and Gynecology

## 2021-05-06 ENCOUNTER — Ambulatory Visit: Payer: 59 | Admitting: Dermatology

## 2021-05-06 ENCOUNTER — Other Ambulatory Visit: Payer: Self-pay

## 2021-05-06 DIAGNOSIS — L988 Other specified disorders of the skin and subcutaneous tissue: Secondary | ICD-10-CM

## 2021-05-06 DIAGNOSIS — L720 Epidermal cyst: Secondary | ICD-10-CM

## 2021-05-06 MED ORDER — DOXYCYCLINE MONOHYDRATE 100 MG PO CAPS: 100.0000 mg | ORAL_CAPSULE | Freq: Two times a day (BID) | ORAL | 0 refills | Status: AC

## 2021-05-06 NOTE — Progress Notes (Signed)
   New Patient Visit  Subjective  Morgan Sullivan is a 40 y.o. female who presents for the following: Skin Problem (Cyst on the left knee x 1 year recently became red and tender ). Pt would like to discuss Botox today for her face  The following portions of the chart were reviewed this encounter and updated as appropriate:   Tobacco  Allergies  Meds  Problems  Med Hx  Surg Hx  Fam Hx      Review of Systems:  No other skin or systemic complaints except as noted in HPI or Assessment and Plan.  Objective  Well appearing patient in no apparent distress; mood and affect are within normal limits.  A focused examination was performed including right knee. Relevant physical exam findings are noted in the Assessment and Plan.  left lateral knee Subcutaneous papule/nodule with erythema and edema, tender to touch.   face Rhytides and volume loss.    Assessment & Plan  Epidermal inclusion cyst left lateral knee  Benign-appearing. Exam most consistent with an epidermal inclusion cyst. Discussed that a cyst is a benign growth that can grow over time and sometimes get irritated or inflamed. Recommend observation if it is not bothersome. Discussed option of surgical excision to remove it if it is growing, symptomatic, or other changes noted. Please call for new or changing lesions so they can be evaluated.   I&D performed today, currently significantly inflamed Bacterial C&S pending  Start Doxycycline 100 mg take 1 tablet twice a day with food x 7 days  Doxycycline should be taken with food to prevent nausea. Do not lay down for 30 minutes after taking. Be cautious with sun exposure and use good sun protection while on this medication. Pregnant women should not take this medication.    Incision and Drainage - left lateral knee Location: right knee   Informed Consent: Discussed risks (permanent scarring, light or dark discoloration, infection, pain, bleeding, bruising, redness, damage to  adjacent structures, and recurrence of the lesion) and benefits of the procedure, as well as the alternatives.  Informed consent was obtained.  Preparation: The area was prepped with alcohol.  Anesthesia: Lidocaine 1% with epinephrine  Procedure Details: An incision was made overlying the lesion. The lesion drained blood and white chalky material.  A small amount of fluid was drained.    Antibiotic ointment and a sterile pressure dressing were applied. The patient tolerated procedure well.  Total number of lesions drained: 1  Plan: The patient was instructed on post-op care. Recommend OTC analgesia as needed for pain.   Related Procedures Anaerobic and Aerobic Culture  Related Medications doxycycline (MONODOX) 100 MG capsule Take 1 capsule (100 mg total) by mouth 2 (two) times daily for 7 days.  Elastosis of skin face  Return to the clinic for Botox   Return if symptoms worsen or fail to improve.   I, Marye Round, CMA, am acting as scribe for Forest Gleason, MD .   Documentation: I have reviewed the above documentation for accuracy and completeness, and I agree with the above.  Forest Gleason, MD

## 2021-05-06 NOTE — Patient Instructions (Signed)

## 2021-05-08 ENCOUNTER — Ambulatory Visit (INDEPENDENT_AMBULATORY_CARE_PROVIDER_SITE_OTHER): Payer: Self-pay | Admitting: Dermatology

## 2021-05-08 ENCOUNTER — Other Ambulatory Visit: Payer: Self-pay

## 2021-05-08 DIAGNOSIS — L988 Other specified disorders of the skin and subcutaneous tissue: Secondary | ICD-10-CM

## 2021-05-08 NOTE — Progress Notes (Signed)
   Follow-Up Visit   Subjective  Morgan Sullivan is a 39 y.o. female who presents for the following: Facial Elastosis (Patient here today for botox injections. Area patient concerned with is forehead. ).  The following portions of the chart were reviewed this encounter and updated as appropriate:   Tobacco  Allergies  Meds  Problems  Med Hx  Surg Hx  Fam Hx      Review of Systems:  No other skin or systemic complaints except as noted in HPI or Assessment and Plan.  Objective  Well appearing patient in no apparent distress; mood and affect are within normal limits.  A focused examination was performed including face. Relevant physical exam findings are noted in the Assessment and Plan.  Head - Anterior (Face) Rhytides and volume loss.             Assessment & Plan  Elastosis of skin Head - Anterior (Face)  Intralesional injection - Head - Anterior (Face) Location: See attached image  Informed consent: Discussed risks (infection, pain, bleeding, bruising, swelling, allergic reaction, paralysis of nearby muscles, eyelid droop, double vision, neck weakness, difficulty breathing, headache, undesirable cosmetic result, and need for additional treatment) and benefits of the procedure, as well as the alternatives.  Informed consent was obtained.  Preparation: The area was cleansed with alcohol.  Procedure Details:  Botox was injected into the dermis with a 30-gauge needle. Pressure applied to any bleeding. Ice packs offered for swelling.  Lot Number:  [id number] Expiration:  02/2023  Total Units Injected:  20  Plan: Patient was instructed to remain upright for 4 hours. Patient was instructed to avoid massaging the face and avoid vigorous exercise for the rest of the day. Tylenol may be used for headache.  Allow 2 weeks before returning to clinic for additional dosing as needed. Patient will call for any problems.   Return in about 2 weeks (around 05/22/2021) for Botox  follow up.  Graciella Belton, RMA, am acting as scribe for Forest Gleason, MD .  Documentation: I have reviewed the above documentation for accuracy and completeness, and I agree with the above.  Forest Gleason, MD

## 2021-05-08 NOTE — Patient Instructions (Signed)

## 2021-05-12 ENCOUNTER — Encounter: Payer: Self-pay | Admitting: Dermatology

## 2021-05-16 ENCOUNTER — Encounter: Payer: Self-pay | Admitting: Dermatology

## 2021-05-16 LAB — ANAEROBIC AND AEROBIC CULTURE: Result 1: NEGATIVE — AB

## 2021-05-20 ENCOUNTER — Telehealth: Payer: Self-pay

## 2021-05-20 NOTE — Telephone Encounter (Signed)
-----   Message from Alfonso Patten, MD sent at 05/20/2021  2:27 PM EDT ----- Anaerobic Culture Final reportAbnormal  Result 1 Finegoldia magnaAbnormal   Culture grew bacteria called Finegoldia magna. If Morgan Sullivan is doing well after I&D, no additional treatment needed. However, if area is still tender, would start cephalexin 500 mg TID. She has follow-up in clinic tomorrow so if unable to reach today can discuss then.  MAs please call. Thank you!

## 2021-05-20 NOTE — Telephone Encounter (Signed)
Patient advised culture + for bacteria. Patient advises she is doing well after course of doxycycline and I&D./js

## 2021-05-22 ENCOUNTER — Encounter: Payer: Self-pay | Admitting: Dermatology

## 2021-05-22 ENCOUNTER — Ambulatory Visit (INDEPENDENT_AMBULATORY_CARE_PROVIDER_SITE_OTHER): Payer: Self-pay | Admitting: Dermatology

## 2021-05-22 ENCOUNTER — Other Ambulatory Visit: Payer: Self-pay

## 2021-05-22 DIAGNOSIS — L988 Other specified disorders of the skin and subcutaneous tissue: Secondary | ICD-10-CM

## 2021-05-22 NOTE — Progress Notes (Signed)
   Follow-Up Visit   Subjective  Morgan Sullivan is a 40 y.o. female who presents for the following: Follow-up (Patient here today for 2 week botox follow up. Patient pleased with results. ).    The following portions of the chart were reviewed this encounter and updated as appropriate:   Tobacco  Allergies  Meds  Problems  Med Hx  Surg Hx  Fam Hx      Review of Systems:  No other skin or systemic complaints except as noted in HPI or Assessment and Plan.  Objective  Well appearing patient in no apparent distress; mood and affect are within normal limits.  A focused examination was performed including face. Relevant physical exam findings are noted in the Assessment and Plan.  Head - Anterior (Face) Rhytides and volume loss.         Assessment & Plan  Elastosis of skin Head - Anterior (Face)  Being cautious with botox dosing at mid forehead, patient with hx of dropping eyebrows after frontalis botox  2.5 units added today  Filling material injection - Head - Anterior (Face) Location: See attached image  Informed consent: Discussed risks (infection, pain, bleeding, bruising, swelling, allergic reaction, paralysis of nearby muscles, eyelid droop, double vision, neck weakness, difficulty breathing, headache, undesirable cosmetic result, and need for additional treatment) and benefits of the procedure, as well as the alternatives.  Informed consent was obtained.  Preparation: The area was cleansed with alcohol.  Procedure Details:  Botox was injected into the dermis with a 30-gauge needle. Pressure applied to any bleeding. Ice packs offered for swelling.  Lot Number: [id number] Expiration: 03/2023  Total Units Injected: 2.5  Plan: Tylenol may be used for headache.  Allow 2 weeks before returning to clinic for additional dosing as needed. Patient will call for any problems.   Return if symptoms worsen or fail to improve.  Graciella Belton, RMA, am acting as scribe  for Forest Gleason, MD .  Documentation: I have reviewed the above documentation for accuracy and completeness, and I agree with the above.  Forest Gleason, MD

## 2021-05-22 NOTE — Progress Notes (Signed)
Opened in error

## 2021-09-25 ENCOUNTER — Other Ambulatory Visit: Payer: Self-pay | Admitting: Obstetrics and Gynecology

## 2021-09-25 ENCOUNTER — Telehealth: Payer: Self-pay

## 2021-09-25 DIAGNOSIS — Z1239 Encounter for other screening for malignant neoplasm of breast: Secondary | ICD-10-CM

## 2021-09-25 NOTE — Telephone Encounter (Signed)
Order placed please let her know to call and schedule  [facility] [place] [address]  [facility]  [address] [place] [address]  Phone: [phone]

## 2021-09-25 NOTE — Telephone Encounter (Signed)
Pt calling for an order for a preventative mammogram be faxed to [facility].  [phone]

## 2021-09-25 NOTE — Telephone Encounter (Signed)
Pt aware.

## 2021-10-24 ENCOUNTER — Ambulatory Visit
Admission: RE | Admit: 2021-10-24 | Discharge: 2021-10-24 | Disposition: A | Discharge status: Home / Self Care | Payer: 59 | Source: Ambulatory Visit | Attending: Obstetrics and Gynecology | Admitting: Obstetrics and Gynecology

## 2021-10-24 ENCOUNTER — Other Ambulatory Visit: Payer: Self-pay

## 2021-10-24 ENCOUNTER — Encounter: Payer: Self-pay | Admitting: Obstetrics & Gynecology

## 2021-10-24 DIAGNOSIS — Z1239 Encounter for other screening for malignant neoplasm of breast: Secondary | ICD-10-CM

## 2021-10-24 DIAGNOSIS — Z1231 Encounter for screening mammogram for malignant neoplasm of breast: Secondary | ICD-10-CM | POA: Insufficient documentation

## 2021-11-04 ENCOUNTER — Other Ambulatory Visit: Payer: Self-pay

## 2021-11-04 ENCOUNTER — Ambulatory Visit (INDEPENDENT_AMBULATORY_CARE_PROVIDER_SITE_OTHER): Payer: Self-pay | Admitting: Dermatology

## 2021-11-04 DIAGNOSIS — L988 Other specified disorders of the skin and subcutaneous tissue: Secondary | ICD-10-CM

## 2021-11-04 NOTE — Progress Notes (Signed)
° °  Follow-Up Visit   Subjective  Morgan Sullivan is a 41 y.o. female who presents for the following: Facial Elastosis (Botox today).  The following portions of the chart were reviewed this encounter and updated as appropriate:   Tobacco   Allergies   Meds   Problems   Med Hx   Surg Hx   Fam Hx      Review of Systems:  No other skin or systemic complaints except as noted in HPI or Assessment and Plan.  Objective  Well appearing patient in no apparent distress; mood and affect are within normal limits.  A focused examination was performed including face. Relevant physical exam findings are noted in the Assessment and Plan.  Face Rhytides and volume loss.             Assessment & Plan  Elastosis of skin Face  Botox Injection - Face Location: See attached image  Informed consent: Discussed risks (infection, pain, bleeding, bruising, swelling, allergic reaction, paralysis of nearby muscles, eyelid droop, double vision, neck weakness, difficulty breathing, headache, undesirable cosmetic result, and need for additional treatment) and benefits of the procedure, as well as the alternatives.  Informed consent was obtained.  Preparation: The area was cleansed with alcohol.  Procedure Details:  Botox was injected into the dermis with a 30-gauge needle. Pressure applied to any bleeding. Ice packs offered for swelling.  Lot Number:  [id number] Expiration:  11/2023  Total Units Injected:  25  Plan: Patient was instructed to remain upright for 4 hours. Patient was instructed to avoid massaging the face and avoid vigorous exercise for the rest of the day. Tylenol may be used for headache.  Allow 2 weeks before returning to clinic for additional dosing as needed. Patient will call for any problems.  Return for Botox in 3-4 months.  I, Ashok Cordia, CMA, am acting as scribe for Sarina Ser, MD . Documentation: I have reviewed the above documentation for accuracy and completeness, and I  agree with the above.  Sarina Ser, MD

## 2021-11-04 NOTE — Patient Instructions (Signed)

## 2021-11-05 ENCOUNTER — Encounter: Payer: Self-pay | Admitting: Dermatology

## 2021-11-22 IMAGING — DX DG CHEST 2V
2 series · 2 of 2 positions shown · non-contrast
Comparison: None.

CLINICAL DATA: 40-year-old female with left side chest and back
pain onset this morning. Former smoker.

EXAM:
CHEST - 2 VIEW

[chest pa]
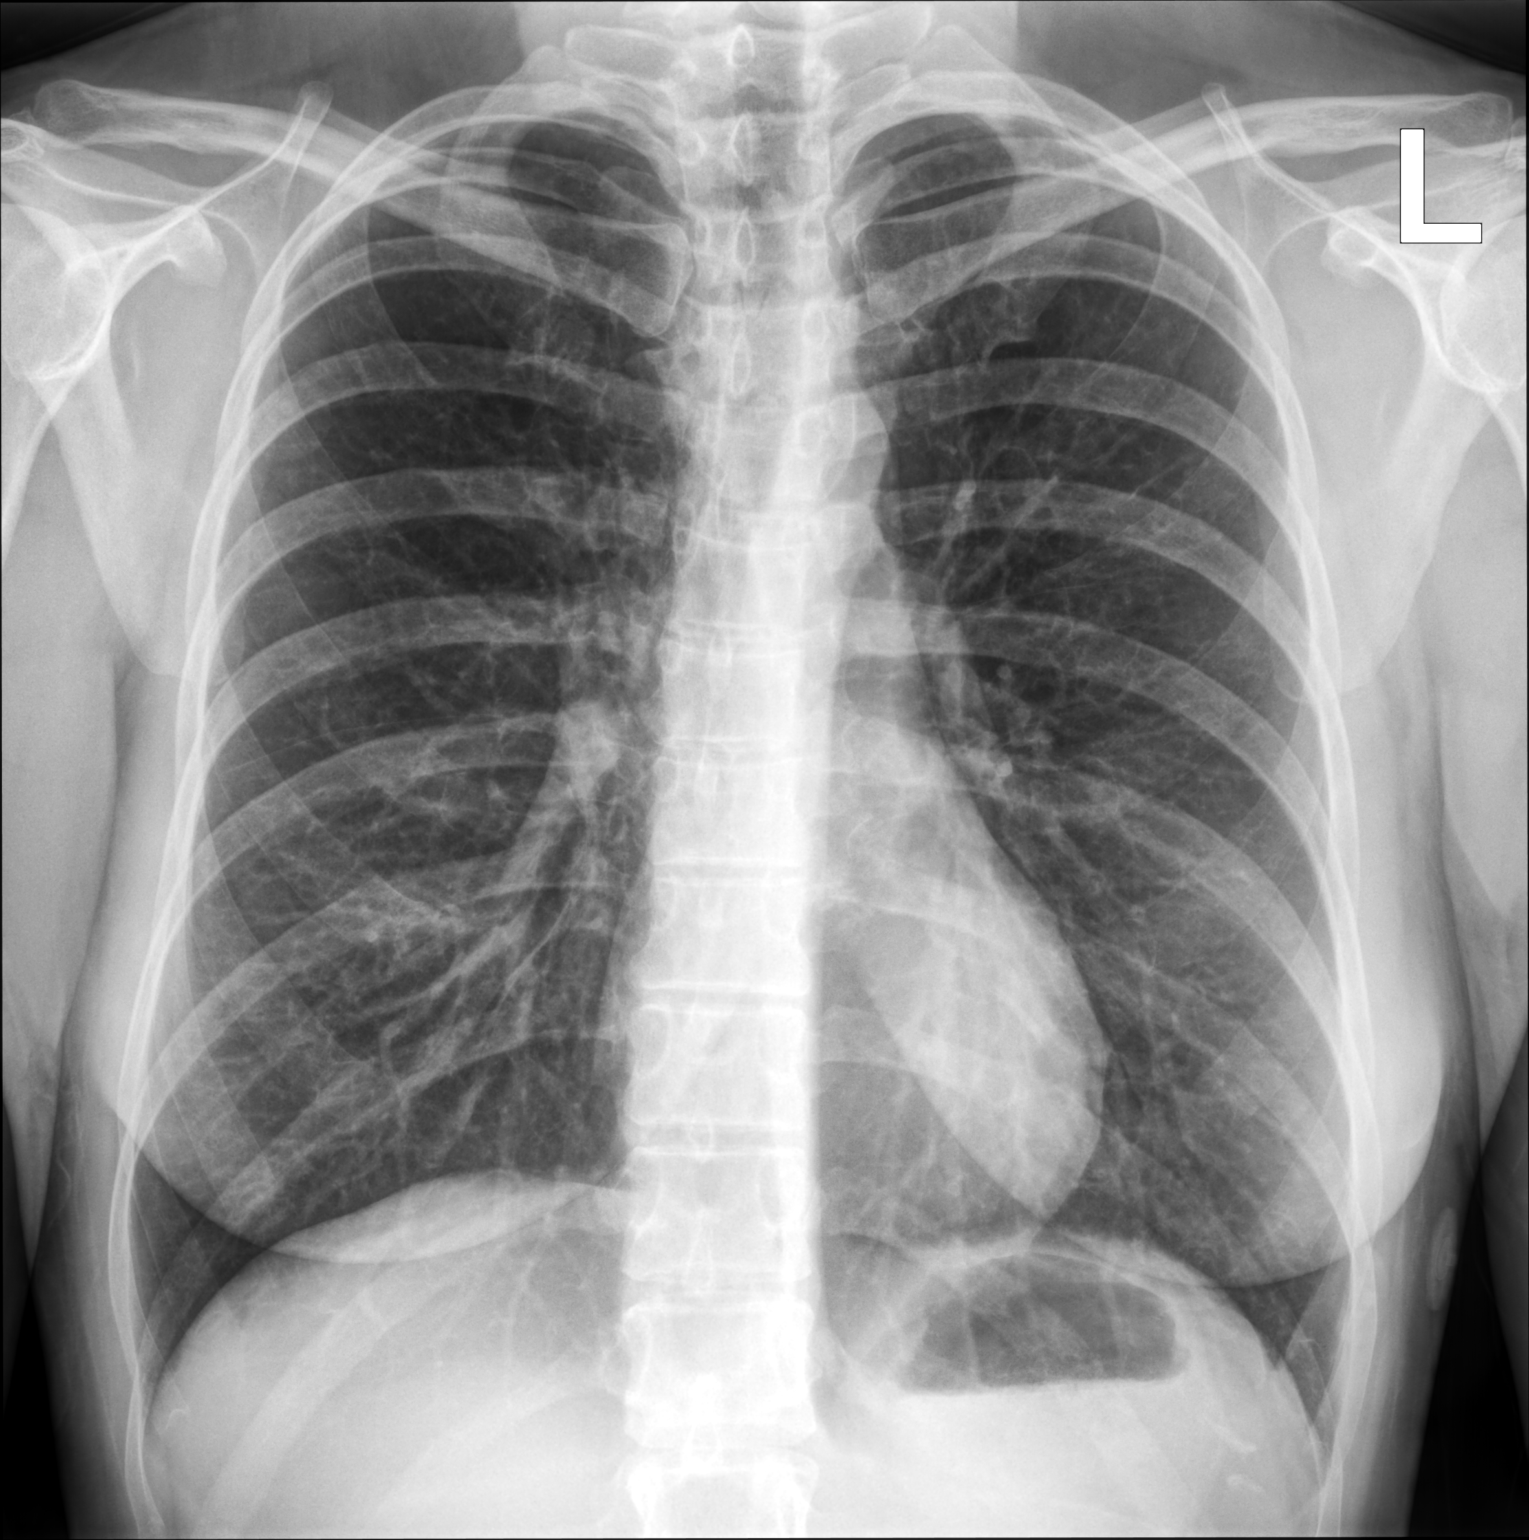

[chest lat]
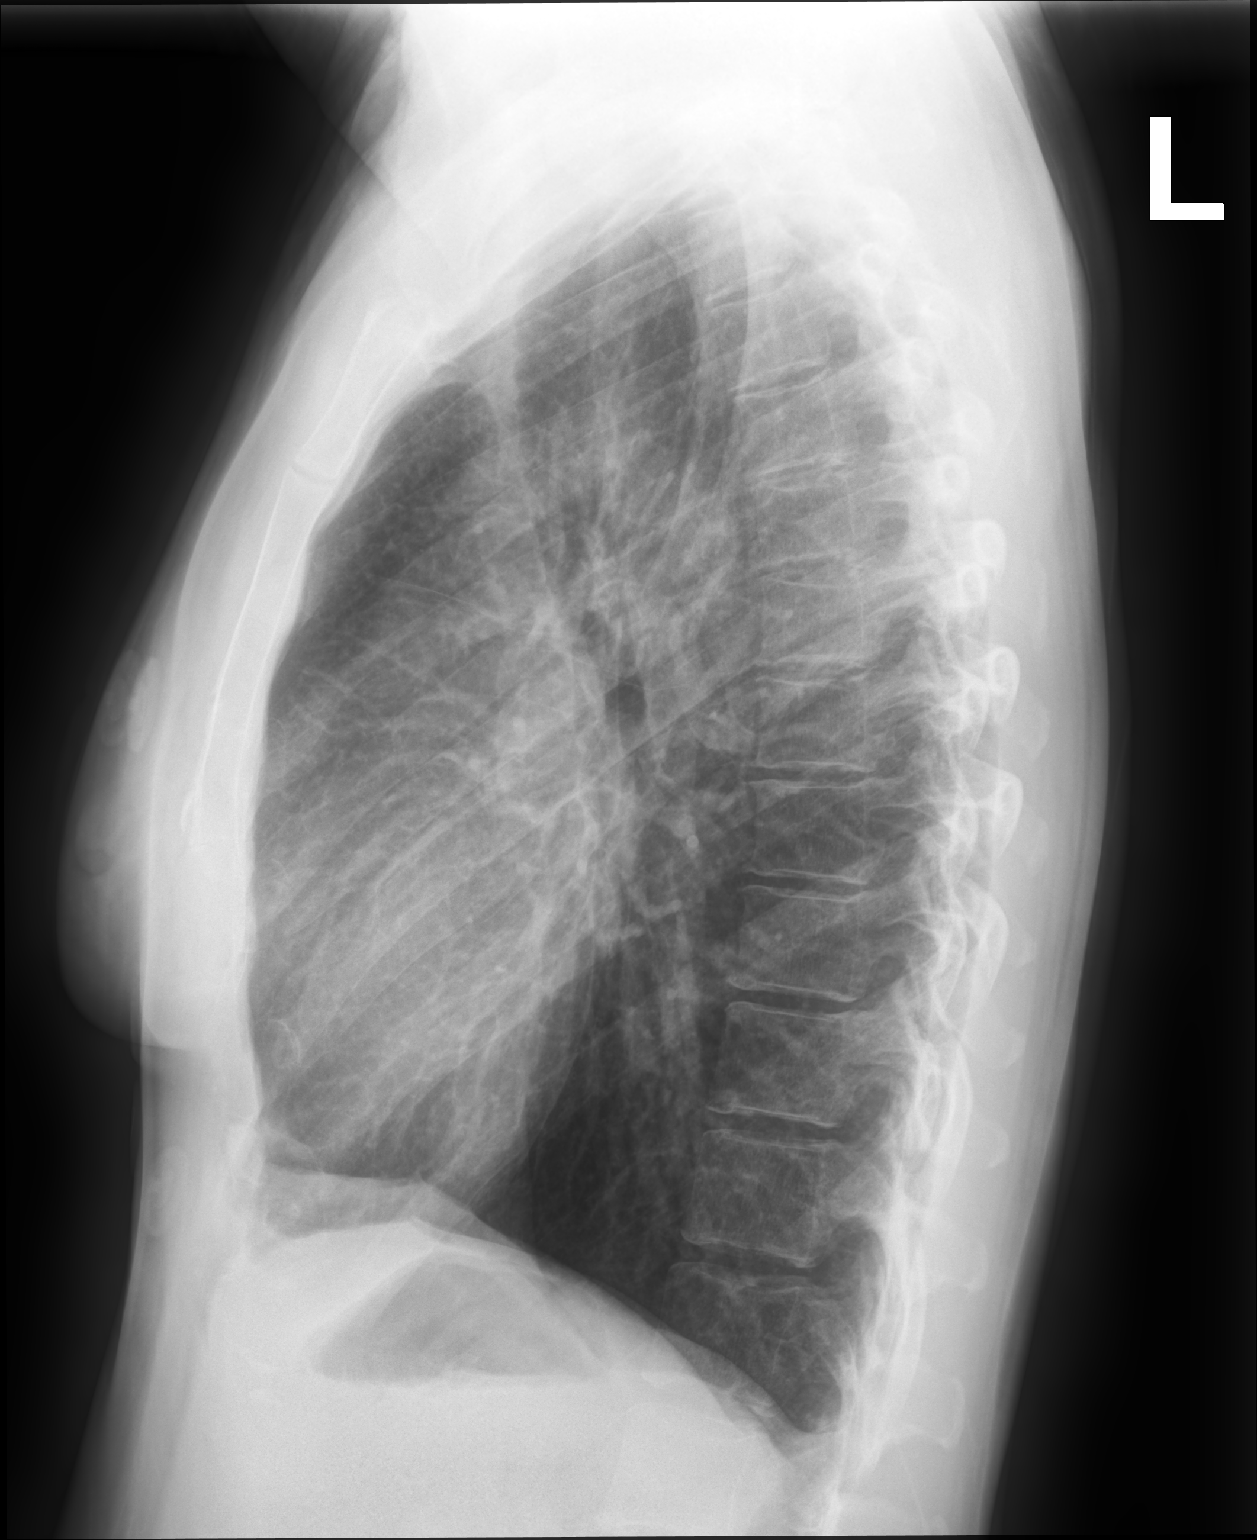

[2 of 2 positions shown; findings below may reference images not displayed]

FINDINGS: Large lung volumes. Normal cardiac size and mediastinal contours.
Mild diffuse increased pulmonary interstitial markings, symmetric.
No pneumothorax, pulmonary edema, pleural effusion or confluent
pulmonary opacity. Peripheral right upper lung EKG button artifact.
Visualized tracheal air column is within normal limits.

No acute osseous abnormality identified. Negative visible bowel gas
pattern.
IMPRESSION: No acute cardiopulmonary abnormality identified. Suspected pulmonary
hyperinflation and chronic interstitial changes.

## 2022-03-10 ENCOUNTER — Ambulatory Visit (INDEPENDENT_AMBULATORY_CARE_PROVIDER_SITE_OTHER): Payer: Self-pay | Admitting: Dermatology

## 2022-03-10 DIAGNOSIS — L988 Other specified disorders of the skin and subcutaneous tissue: Secondary | ICD-10-CM

## 2022-03-10 NOTE — Progress Notes (Signed)
   Follow-Up Visit   Subjective  Zailey D Fake is a 41 y.o. female who presents for the following: Facial Elastosis (Patient is here today for Botox injections. She felt her brow dropped the last time she got injections and would like to discuss less Botox in the forehead).  The following portions of the chart were reviewed this encounter and updated as appropriate:   Tobacco  Allergies  Meds  Problems  Med Hx  Surg Hx  Fam Hx     Review of Systems:  No other skin or systemic complaints except as noted in HPI or Assessment and Plan.  Objective  Well appearing patient in no apparent distress; mood and affect are within normal limits.  A focused examination was performed including the face. Relevant physical exam findings are noted in the Assessment and Plan.  Face Rhytides and volume loss.            Assessment & Plan  Elastosis of skin Face  Botox 21.5 units injected as marked: - Frown complex 17.5 units - Forehead 4 units  We decreased the forehead from 7.5 units to 4 units and injected 4.0 cm above the brow due to eyebrow dropping.   Botox Injection - Face Location: See attached image  Informed consent: Discussed risks (infection, pain, bleeding, bruising, swelling, allergic reaction, paralysis of nearby muscles, eyelid droop, double vision, neck weakness, difficulty breathing, headache, undesirable cosmetic result, and need for additional treatment) and benefits of the procedure, as well as the alternatives.  Informed consent was obtained.  Preparation: The area was cleansed with alcohol.  Procedure Details:  Botox was injected into the dermis with a 30-gauge needle. Pressure applied to any bleeding. Ice packs offered for swelling.  Lot Number:  [id number] Expiration:  01/2024  Total Units Injected:  21.5  Plan: Patient was instructed to remain upright for 4 hours. Patient was instructed to avoid massaging the face and avoid vigorous exercise for the rest  of the day. Tylenol may be used for headache.  Allow 2 weeks before returning to clinic for additional dosing as needed. Patient will call for any problems.  Return for Botox in 3-4 mths.  Luther Redo, CMA, am acting as scribe for Sarina Ser, MD . Documentation: I have reviewed the above documentation for accuracy and completeness, and I agree with the above.  Sarina Ser, MD

## 2022-03-10 NOTE — Patient Instructions (Signed)
Due to recent changes in healthcare laws, you may see results of your pathology and/or laboratory studies on MyChart before the doctors have had a chance to review them. We understand that in some cases there may be results that are confusing or concerning to you. Please understand that not all results are received at the same time and often the doctors may need to interpret multiple results in order to provide you with the best plan of care or course of treatment. Therefore, we ask that you please give us 2 business days to thoroughly review all your results before contacting the office for clarification. Should we see a critical lab result, you will be contacted sooner.   If You Need Anything After Your Visit  If you have any questions or concerns for your doctor, please call our main line at [phone] and press option 4 to reach your doctor's medical assistant. If no one answers, please leave a voicemail as directed and we will return your call as soon as possible. Messages left after 4 pm will be answered the following business day.   You may also send us a message via MyChart. We typically respond to MyChart messages within 1-2 business days.  For prescription refills, please ask your pharmacy to contact our office. Our fax number is [phone].  If you have an urgent issue when the clinic is closed that cannot wait until the next business day, you can page your doctor at the number below.    Please note that while we do our best to be available for urgent issues outside of office hours, we are not available 24/7.   If you have an urgent issue and are unable to reach us, you may choose to seek medical care at your doctor's office, retail clinic, urgent care center, or emergency room.  If you have a medical emergency, please immediately call 911 or go to the emergency department.  Pager Numbers  - Dr. Kowalski: [phone]  - Dr. Moye: [phone]  - Dr. Stewart:  [phone]  In the event of inclement weather, please call our main line at [phone] for an update on the status of any delays or closures.  Dermatology Medication Tips: Please keep the boxes that topical medications come in in order to help keep track of the instructions about where and how to use these. Pharmacies typically print the medication instructions only on the boxes and not directly on the medication tubes.   If your medication is too expensive, please contact our office at [phone] option 4 or send us a message through MyChart.   We are unable to tell what your co-pay for medications will be in advance as this is different depending on your insurance coverage. However, we may be able to find a substitute medication at lower cost or fill out paperwork to get insurance to cover a needed medication.   If a prior authorization is required to get your medication covered by your insurance company, please allow us 1-2 business days to complete this process.  Drug prices often vary depending on where the prescription is filled and some pharmacies may offer cheaper prices.  The website www.goodrx.com contains coupons for medications through different pharmacies. The prices here do not account for what the cost may be with help from insurance (it may be cheaper with your insurance), but the website can give you the price if you did not use any insurance.  - You can print the associated coupon and take it with   your prescription to the pharmacy.  - You may also stop by our office during regular business hours and pick up a GoodRx coupon card.  - If you need your prescription sent electronically to a different pharmacy, notify our office through [facility] MyChart or by phone at [phone] option 4.     Si Usted Necesita Algo Despus de Su Visita  Tambin puede enviarnos un mensaje a travs de MyChart. Por lo general respondemos a los mensajes de MyChart en el transcurso de 1 a 2  das hbiles.  Para renovar recetas, por favor pida a su farmacia que se ponga en contacto con nuestra oficina. Nuestro nmero de fax es el [phone].  Si tiene un asunto urgente cuando la clnica est cerrada y que no puede esperar hasta el siguiente da hbil, puede llamar/localizar a su doctor(a) al nmero que aparece a continuacin.   Por favor, tenga en cuenta que aunque hacemos todo lo posible para estar disponibles para asuntos urgentes fuera del horario de oficina, no estamos disponibles las 24 horas del da, los 7 das de la semana.   Si tiene un problema urgente y no puede comunicarse con nosotros, puede optar por buscar atencin mdica  en el consultorio de su doctor(a), en una clnica privada, en un centro de atencin urgente o en una sala de emergencias.  Si tiene una emergencia mdica, por favor llame inmediatamente al 911 o vaya a la sala de emergencias.  Nmeros de bper  - Dr. Kowalski: [phone]  - Dra. Moye: [phone]  - Dra. Stewart: [phone]  En caso de inclemencias del tiempo, por favor llame a nuestra lnea principal al [phone] para una actualizacin sobre el estado de cualquier retraso o cierre.  Consejos para la medicacin en dermatologa: Por favor, guarde las cajas en las que vienen los medicamentos de uso tpico para ayudarle a seguir las instrucciones sobre dnde y cmo usarlos. Las farmacias generalmente imprimen las instrucciones del medicamento slo en las cajas y no directamente en los tubos del medicamento.   Si su medicamento es muy caro, por favor, pngase en contacto con nuestra oficina llamando al [phone] y presione la opcin 4 o envenos un mensaje a travs de MyChart.   No podemos decirle cul ser su copago por los medicamentos por adelantado ya que esto es diferente dependiendo de la cobertura de su seguro. Sin embargo, es posible que podamos encontrar un medicamento sustituto a menor costo o llenar un formulario para que el  seguro cubra el medicamento que se considera necesario.   Si se requiere una autorizacin previa para que su compaa de seguros cubra su medicamento, por favor permtanos de 1 a 2 das hbiles para completar este proceso.  Los precios de los medicamentos varan con frecuencia dependiendo del lugar de dnde se surte la receta y alguna farmacias pueden ofrecer precios ms baratos.  El sitio web www.goodrx.com tiene cupones para medicamentos de diferentes farmacias. Los precios aqu no tienen en cuenta lo que podra costar con la ayuda del seguro (puede ser ms barato con su seguro), pero el sitio web puede darle el precio si no utiliz ningn seguro.  - Puede imprimir el cupn correspondiente y llevarlo con su receta a la farmacia.  - Tambin puede pasar por nuestra oficina durante el horario de atencin regular y recoger una tarjeta de cupones de GoodRx.  - Si necesita que su receta se enve electrnicamente a una farmacia diferente, informe a nuestra oficina a travs de MyChart de [facility]   o por telfono llamando al [phone] y presione la opcin 4.  

## 2022-03-12 ENCOUNTER — Encounter: Payer: Self-pay | Admitting: Dermatology

## 2022-08-12 ENCOUNTER — Encounter (HOSPITAL_BASED_OUTPATIENT_CLINIC_OR_DEPARTMENT_OTHER): Payer: Self-pay | Admitting: Obstetrics and Gynecology

## 2022-08-12 NOTE — Progress Notes (Signed)
Spoke w/ via phone for pre-op interview---[facility] needs dos----  NONE per anesthesia, surgeon orders pending.             Lab results------ COVID test -----patient states asymptomatic no test needed Arrive at -------0845 NPO after MN NO Solid Food.  Clear liquids from MN until---0745 Med rec completed Medications to take morning of surgery -----Zoloft Diabetic medication ----- Patient instructed no nail polish to be worn day of surgery Patient instructed to bring photo id and insurance card day of surgery Patient aware to have Driver (ride ) / caregiver  UNsure of driver, aware she has to have ride and someone with her 24hrs after.  for 24 hours after surgery  Patient Special Instructions ----- Pre-Op special Istructions ----- Patient verbalized understanding of instructions that were given at this phone interview. Patient denies shortness of breath, chest pain, fever, cough at this phone interview.

## 2022-08-17 NOTE — Anesthesia Preprocedure Evaluation (Signed)
Anesthesia Evaluation  Patient identified by MRN, date of birth, ID band Patient awake    Reviewed: Allergy & Precautions, NPO status , Patient's Chart, lab work & pertinent test results  History of Anesthesia Complications (+) history of anesthetic complications  Airway Mallampati: II  TM Distance: >3 FB Neck ROM: Full    Dental no notable dental hx. (+) Teeth Intact, Dental Advisory Given   Pulmonary former smoker   Pulmonary exam normal breath sounds clear to auscultation       Cardiovascular Normal cardiovascular exam Rhythm:Regular Rate:Normal     Neuro/Psych  PSYCHIATRIC DISORDERS  Depression       GI/Hepatic ,GERD  ,,  Endo/Other    Renal/GU      Musculoskeletal   Abdominal   Peds  Hematology   Anesthesia Other Findings   Reproductive/Obstetrics                             Anesthesia Physical Anesthesia Plan  ASA: 2  Anesthesia Plan: General   Post-op Pain Management: Minimal or no pain anticipated, Toradol IV (intra-op)* and Precedex   Induction:   PONV Risk Score and Plan: Treatment may vary due to age or medical condition, Midazolam, Ondansetron, TIVA and Propofol infusion  Airway Management Planned: LMA  Additional Equipment: None  Intra-op Plan:   Post-operative Plan:   Informed Consent: I have reviewed the patients History and Physical, chart, labs and discussed the procedure including the risks, benefits and alternatives for the proposed anesthesia with the patient or authorized representative who has indicated his/her understanding and acceptance.     Dental advisory given  Plan Discussed with: CRNA  Anesthesia Plan Comments:        Anesthesia Quick Evaluation

## 2022-08-18 ENCOUNTER — Ambulatory Visit (HOSPITAL_BASED_OUTPATIENT_CLINIC_OR_DEPARTMENT_OTHER): Payer: 59 | Admitting: Anesthesiology

## 2022-08-18 ENCOUNTER — Encounter (HOSPITAL_BASED_OUTPATIENT_CLINIC_OR_DEPARTMENT_OTHER): Payer: Self-pay | Admitting: Obstetrics and Gynecology

## 2022-08-18 ENCOUNTER — Other Ambulatory Visit: Payer: Self-pay

## 2022-08-18 ENCOUNTER — Ambulatory Visit (HOSPITAL_BASED_OUTPATIENT_CLINIC_OR_DEPARTMENT_OTHER)
Admission: RE | Admit: 2022-08-18 | Discharge: 2022-08-18 | Disposition: A | Discharge status: Home / Self Care | Payer: 59 | Source: Ambulatory Visit | Attending: Obstetrics and Gynecology | Admitting: Obstetrics and Gynecology

## 2022-08-18 ENCOUNTER — Encounter (HOSPITAL_BASED_OUTPATIENT_CLINIC_OR_DEPARTMENT_OTHER)
Admission: RE | Disposition: A | Discharge status: Home / Self Care | Payer: Self-pay | Source: Ambulatory Visit | Attending: Obstetrics and Gynecology

## 2022-08-18 DIAGNOSIS — O034 Incomplete spontaneous abortion without complication: Secondary | ICD-10-CM

## 2022-08-18 DIAGNOSIS — F32A Depression, unspecified: Secondary | ICD-10-CM | POA: Insufficient documentation

## 2022-08-18 DIAGNOSIS — Z3A01 Less than 8 weeks gestation of pregnancy: Secondary | ICD-10-CM | POA: Diagnosis not present

## 2022-08-18 DIAGNOSIS — O039 Complete or unspecified spontaneous abortion without complication: Secondary | ICD-10-CM

## 2022-08-18 DIAGNOSIS — Z87891 Personal history of nicotine dependence: Secondary | ICD-10-CM | POA: Insufficient documentation

## 2022-08-18 HISTORY — PX: DILATION AND EVACUATION: SHX1459

## 2022-08-18 LAB — CBC
HCT: 42.8 % (ref 36.0–46.0)
Hemoglobin: 14.2 g/dL (ref 12.0–15.0)
MCH: 29 pg (ref 26.0–34.0)
MCHC: 33.2 g/dL (ref 30.0–36.0)
MCV: 87.3 fL (ref 80.0–100.0)
Platelets: 273 10*3/uL (ref 150–400)
RBC: 4.9 MIL/uL (ref 3.87–5.11)
RDW: 12.6 % (ref 11.5–15.5)
WBC: 7.5 10*3/uL (ref 4.0–10.5)
nRBC: 0 % (ref 0.0–0.2)

## 2022-08-18 LAB — TYPE AND SCREEN
ABO/RH(D): O POS
Antibody Screen: NEGATIVE

## 2022-08-18 SURGERY — DILATION AND EVACUATION, UTERUS
Anesthesia: General | Site: Uterus

## 2022-08-18 MED ORDER — FERRIC SUBSULFATE 259 MG/GM EX SOLN
CUTANEOUS | Status: DC | PRN
  Administered 2022-08-18: 1 via TOPICAL

## 2022-08-18 MED ORDER — MIDAZOLAM HCL 2 MG/2ML IJ SOLN
INTRAMUSCULAR | Status: DC | PRN
  Administered 2022-08-18: 2 mg via INTRAVENOUS

## 2022-08-18 MED ORDER — PROPOFOL 10 MG/ML IV BOLUS
INTRAVENOUS | Status: AC
  Filled 2022-08-18: qty 20

## 2022-08-18 MED ORDER — OXYCODONE HCL 5 MG/5ML PO SOLN: 5.0000 mg | Freq: Once | ORAL | Status: DC | PRN

## 2022-08-18 MED ORDER — TRANEXAMIC ACID-NACL 1000-0.7 MG/100ML-% IV SOLN
INTRAVENOUS | Status: DC | PRN
  Administered 2022-08-18: 1000 mg via INTRAVENOUS

## 2022-08-18 MED ORDER — METHYLERGONOVINE MALEATE 0.2 MG/ML IJ SOLN
INTRAMUSCULAR | Status: DC | PRN
  Administered 2022-08-18: .2 mg via INTRAMUSCULAR

## 2022-08-18 MED ORDER — PROPOFOL 500 MG/50ML IV EMUL
INTRAVENOUS | Status: DC | PRN
  Administered 2022-08-18: 200 ug/kg/min via INTRAVENOUS

## 2022-08-18 MED ORDER — FENTANYL CITRATE (PF) 100 MCG/2ML IJ SOLN
INTRAMUSCULAR | Status: AC
  Filled 2022-08-18: qty 2

## 2022-08-18 MED ORDER — PROPOFOL 10 MG/ML IV BOLUS
INTRAVENOUS | Status: DC | PRN
  Administered 2022-08-18: 140 mg via INTRAVENOUS

## 2022-08-18 MED ORDER — LIDOCAINE 2% (20 MG/ML) 5 ML SYRINGE
INTRAMUSCULAR | Status: DC | PRN
  Administered 2022-08-18: 60 mg via INTRAVENOUS

## 2022-08-18 MED ORDER — KETOROLAC TROMETHAMINE 30 MG/ML IJ SOLN: 30.0000 mg | Freq: Once | INTRAMUSCULAR | Status: DC | PRN

## 2022-08-18 MED ORDER — ONDANSETRON HCL 4 MG/2ML IJ SOLN
INTRAMUSCULAR | Status: AC
  Filled 2022-08-18: qty 2

## 2022-08-18 MED ORDER — POVIDONE-IODINE 10 % EX SWAB: 2.0000 | Freq: Once | CUTANEOUS | Status: DC

## 2022-08-18 MED ORDER — ONDANSETRON HCL 4 MG/2ML IJ SOLN
INTRAMUSCULAR | Status: DC | PRN
  Administered 2022-08-18: 4 mg via INTRAVENOUS

## 2022-08-18 MED ORDER — SODIUM CHLORIDE 0.9 % IV SOLN
2.0000 g | INTRAVENOUS | Status: AC
  Administered 2022-08-18: 2 g via INTRAVENOUS

## 2022-08-18 MED ORDER — HYDROMORPHONE HCL 1 MG/ML IJ SOLN: 0.2500 mg | INTRAMUSCULAR | Status: DC | PRN

## 2022-08-18 MED ORDER — DEXMEDETOMIDINE HCL IN NACL 200 MCG/50ML IV SOLN
INTRAVENOUS | Status: DC | PRN
  Administered 2022-08-18: 12 ug via INTRAVENOUS

## 2022-08-18 MED ORDER — KETOROLAC TROMETHAMINE 30 MG/ML IJ SOLN
INTRAMUSCULAR | Status: DC | PRN
  Administered 2022-08-18: 30 mg via INTRAVENOUS

## 2022-08-18 MED ORDER — KETOROLAC TROMETHAMINE 30 MG/ML IJ SOLN
INTRAMUSCULAR | Status: AC
  Filled 2022-08-18: qty 1

## 2022-08-18 MED ORDER — DEXAMETHASONE SODIUM PHOSPHATE 10 MG/ML IJ SOLN
INTRAMUSCULAR | Status: AC
  Filled 2022-08-18: qty 1

## 2022-08-18 MED ORDER — TRANEXAMIC ACID-NACL 1000-0.7 MG/100ML-% IV SOLN
INTRAVENOUS | Status: AC
  Filled 2022-08-18: qty 100

## 2022-08-18 MED ORDER — LACTATED RINGERS IV SOLN: INTRAVENOUS | Status: DC

## 2022-08-18 MED ORDER — FENTANYL CITRATE (PF) 100 MCG/2ML IJ SOLN
INTRAMUSCULAR | Status: DC | PRN
  Administered 2022-08-18: 50 ug via INTRAVENOUS

## 2022-08-18 MED ORDER — 0.9 % SODIUM CHLORIDE (POUR BTL) OPTIME
TOPICAL | Status: DC | PRN
  Administered 2022-08-18: 500 mL

## 2022-08-18 MED ORDER — SODIUM CHLORIDE 0.9 % IV SOLN
INTRAVENOUS | Status: AC
  Filled 2022-08-18: qty 2

## 2022-08-18 MED ORDER — MIDAZOLAM HCL 2 MG/2ML IJ SOLN
INTRAMUSCULAR | Status: AC
  Filled 2022-08-18: qty 2

## 2022-08-18 MED ORDER — ONDANSETRON HCL 4 MG/2ML IJ SOLN: 4.0000 mg | Freq: Once | INTRAMUSCULAR | Status: DC | PRN

## 2022-08-18 MED ORDER — LIDOCAINE HCL 1 % IJ SOLN
INTRAMUSCULAR | Status: DC | PRN
  Administered 2022-08-18: 10 mL

## 2022-08-18 MED ORDER — OXYCODONE HCL 5 MG PO TABS: 5.0000 mg | ORAL_TABLET | Freq: Once | ORAL | Status: DC | PRN

## 2022-08-18 MED ORDER — DEXAMETHASONE SODIUM PHOSPHATE 10 MG/ML IJ SOLN
INTRAMUSCULAR | Status: DC | PRN
  Administered 2022-08-18 (×2): 4 mg via INTRAVENOUS

## 2022-08-18 MED ORDER — LIDOCAINE HCL (PF) 2 % IJ SOLN
INTRAMUSCULAR | Status: AC
  Filled 2022-08-18: qty 5

## 2022-08-18 SURGICAL SUPPLY — 26 items
CATH ROBINSON RED A/P 16FR (CATHETERS) ×1 IMPLANT
CLOTH BEACON ORANGE TIMEOUT ST (SAFETY) ×2 IMPLANT
COUNTER NEEDLE 1200 MAGNETIC (NEEDLE) ×1 IMPLANT
DRSG TELFA 3X8 NADH STRL (GAUZE/BANDAGES/DRESSINGS) ×1 IMPLANT
FILTER UTR ASPR ASSEMBLY (MISCELLANEOUS) ×1 IMPLANT
GAUZE 4X4 16PLY ~~LOC~~+RFID DBL (SPONGE) ×1 IMPLANT
GLOVE BIO SURGEON STRL SZ8.5 (GLOVE) ×1 IMPLANT
GLOVE SURG ORTHO 8.0 STRL STRW (GLOVE) ×1 IMPLANT
GOWN STRL REUS W/TWL XL LVL3 (GOWN DISPOSABLE) ×1 IMPLANT
HOSE CONNECTING 18IN BERKELEY (TUBING) ×1 IMPLANT
KIT BERKELEY 1ST TRI 3/8 NO TR (MISCELLANEOUS) ×1 IMPLANT
KIT BERKELEY 1ST TRIMESTER 3/8 (MISCELLANEOUS) ×1 IMPLANT
KIT TURNOVER CYSTO (KITS) ×1 IMPLANT
NS IRRIG 500ML POUR BTL (IV SOLUTION) IMPLANT
PACK VAGINAL MINOR WOMEN LF (CUSTOM PROCEDURE TRAY) ×1 IMPLANT
PAD OB MATERNITY 4.3X12.25 (PERSONAL CARE ITEMS) ×1 IMPLANT
PAD PREP 24X48 CUFFED NSTRL (MISCELLANEOUS) ×1 IMPLANT
SCOPETTES 8  STERILE (MISCELLANEOUS) ×1
SCOPETTES 8 STERILE (MISCELLANEOUS) IMPLANT
SET BERKELEY SUCTION TUBING (SUCTIONS) ×1 IMPLANT
SURGILUBE 2OZ TUBE FLIPTOP (MISCELLANEOUS) ×1 IMPLANT
TOWEL OR 17X26 10 PK STRL BLUE (TOWEL DISPOSABLE) ×1 IMPLANT
VACURETTE 7MM CVD STRL WRAP (CANNULA) IMPLANT
VACURETTE 8 RIGID CVD (CANNULA) IMPLANT
VACURETTE 9 RIGID CVD (CANNULA) IMPLANT
WATER STERILE IRR 500ML POUR (IV SOLUTION) ×1 IMPLANT

## 2022-08-18 NOTE — Transfer of Care (Signed)
Immediate Anesthesia Transfer of Care Note  Patient: Morgan Sullivan  Procedure(s) Performed: Procedure(s) (LRB): DILATATION AND EVACUATION (N/A)  Patient Location: PACU  Anesthesia Type: General  Level of Consciousness: awake, oriented, sedated and patient cooperative  Airway & Oxygen Therapy: Patient Spontanous Breathing and Patient connected to nasal cannula oxygen  Post-op Assessment: Report given to PACU RN and Post -op Vital signs reviewed and stable  Post vital signs: Reviewed and stable  Complications: No apparent anesthesia complications Last Vitals:  Vitals Value Taken Time  BP 107/64 08/18/22 1222  Temp 36.7 C 08/18/22 1222  Pulse 65 08/18/22 1226  Resp 15 08/18/22 1226  SpO2 98 % 08/18/22 1226  Vitals shown include unvalidated device data.  Last Pain:  Vitals:   08/18/22 0921  TempSrc: Oral  PainSc: 0-No pain      Patients Stated Pain Goal: 3 (57/50/51 8335)  Complications: No notable events documented.

## 2022-08-18 NOTE — Anesthesia Postprocedure Evaluation (Signed)
Anesthesia Post Note  Patient: Morgan Sullivan  Procedure(s) Performed: DILATATION AND EVACUATION (Uterus)     Patient location during evaluation: PACU Anesthesia Type: General Level of consciousness: awake and alert Pain management: pain level controlled Vital Signs Assessment: post-procedure vital signs reviewed and stable Respiratory status: spontaneous breathing, nonlabored ventilation, respiratory function stable and patient connected to nasal cannula oxygen Cardiovascular status: blood pressure returned to baseline and stable Postop Assessment: no apparent nausea or vomiting Anesthetic complications: no  No notable events documented.  Last Vitals:  Vitals:   08/18/22 1255 08/18/22 1340  BP: 107/60 117/74  Pulse: 66 71  Resp: 12 14  Temp:  36.6 C  SpO2: 99% 100%    Last Pain:  Vitals:   08/18/22 1340  TempSrc: Oral  PainSc:                  Barnet Glasgow

## 2022-08-18 NOTE — H&P (Signed)
Morgan Sullivan is an 41 y.o. female. With a known fetal demise 6 1/2 size.  She has done misoprostol protocol 3 times with cramping and bleeding but without passage of tissue, confirm by recent US.  She presents for D&E due to incomplete miscarriage   Past Medical History:  Diagnosis Date   Anemia    Depression    Family history of non-recurrent fetal loss 06/07/2018   GERD (gastroesophageal reflux disease)    DUE TO PREGNANCY-TUMS PRN   Heart murmur    AS A CHILD-ASYMPTOMATIC-FOLLOWED BY PEDIATRIC CARDIOLOGIST AND THEN RELEASED    Past Surgical History:  Procedure Laterality Date   CESAREAN SECTION N/A 07/10/2019   Procedure: CESAREAN SECTION;  Surgeon: Malachy Mood, MD;  Location: [facility] ORS;  Service: Obstetrics;  Laterality: N/A;   DILATION AND EVACUATION N/A 06/07/2018   Procedure: DILATATION AND EVACUATION;  Surgeon: Will Bonnet, MD;  Location: [facility] ORS;  Service: Gynecology;  Laterality: N/A;    Family History  Problem Relation Age of Onset   Skin cancer Mother 45   Depression Mother    Hyperlipidemia Father    Alzheimer's disease Maternal Grandmother    Prostate cancer Maternal Grandfather    Colon cancer Paternal Grandfather    Breast cancer Neg Hx     Social History:  reports that she quit smoking about 16 years ago. Her smoking use included cigarettes. She has a 5.00 pack-year smoking history. She has never used smokeless tobacco. She reports current alcohol use of about 1.0 standard drink of alcohol per week. She reports that she does not use drugs.  Allergies: No Known Allergies  Medications Prior to Admission  Medication Sig Dispense Refill Last Dose   ibuprofen (ADVIL) 800 MG tablet Take 1 tablet (800 mg total) by mouth every 8 (eight) hours. 30 tablet 0 08/17/2022   ondansetron (ZOFRAN) 4 MG tablet Take 4 mg by mouth every 8 (eight) hours as needed for nausea or vomiting.   08/18/2022 at 0800   sertraline (ZOLOFT) 100 MG tablet Take 1 tablet (100 mg  total) by mouth daily. 90 tablet 0 08/18/2022 at 0630   HYDROcodone-acetaminophen (NORCO/VICODIN) 5-325 MG tablet Take 1 tablet by mouth every 6 (six) hours as needed. 10 tablet 0    Prenatal Vit-Fe Fumarate-FA (MULTIVITAMIN-PRENATAL) 27-0.8 MG TABS tablet Take 1 tablet by mouth daily at 12 noon. (Patient not taking: Reported on 07/03/2020)       Review of Systems  Height '5\' 2"'$  (1.575 m), weight 63.5 kg, last menstrual period 06/01/2022, currently breastfeeding. Physical Exam Vitals and nursing note reviewed. Exam conducted with a chaperone present.  Constitutional:      Appearance: Normal appearance.  HENT:     Head: Normocephalic.  Eyes:     Pupils: Pupils are equal, round, and reactive to light.  Cardiovascular:     Rate and Rhythm: Normal rate and regular rhythm.     Pulses: Normal pulses.  Abdominal:     General: Abdomen is Gravid, nontender Neurological:     Mental Status: She is alert. [place] 6 1/2 fetal demise Cx closed  No results found for this or any previous visit (from the past 24 hour(s)).  No results found.  Assessment/Plan: Incomplete miscarriage at 6 1/2 failed conservative management Plan D&E We discussed the procedure at length.  The risks and benefits and alternative treatments were discussed.  Risks include, but not limited to, injury to bowel, bladder, uterus, tubes ovaries, risks associated with possible laparotomy, blood transfusion, and  infection.  All of her questions were answered and she gives informed consent.We discussed the procedure at length.  The risks and benefits and alternative treatments were discussed.  Risks include, but not limited to, injury to bowel, bladder, uterus, tubes ovaries, risks associated with possible laparotomy, blood transfusion, and infection.  All of her questions were answered and she gives informed consent. RH positive  Luz Lex 08/18/2022, 9:19 AM

## 2022-08-18 NOTE — Progress Notes (Signed)
Pt's belongings returned to pt. Cell phone, book and contacts in belongings bag.

## 2022-08-18 NOTE — Discharge Instructions (Addendum)
  Post Anesthesia Home Care Instructions  Activity: Get plenty of rest for the remainder of the day. A responsible individual must stay with you for 24 hours following the procedure.  For the next 24 hours, DO NOT: -Drive a car -Paediatric nurse -Drink alcoholic beverages -Take any medication unless instructed by your physician -Make any legal decisions or sign important papers.  Meals: Start with liquid foods such as gelatin or soup. Progress to regular foods as tolerated. Avoid greasy, spicy, heavy foods. If nausea and/or vomiting occur, drink only clear liquids until the nausea and/or vomiting subsides. Call your physician if vomiting continues.  Special Instructions/Symptoms: Your throat may feel dry or sore from the anesthesia or the breathing tube placed in your throat during surgery. If this causes discomfort, gargle with warm salt water. The discomfort should disappear within 24 hours.  If you had a scopolamine patch placed behind your ear for the management of post- operative nausea and/or vomiting:  1. The medication in the patch is effective for 72 hours, after which it should be removed.  Wrap patch in a tissue and discard in the trash. Wash hands thoroughly with soap and water. 2. You may remove the patch earlier than 72 hours if you experience unpleasant side effects which may include dry mouth, dizziness or visual disturbances. 3. Avoid touching the patch. Wash your hands with soap and water after contact with the patch.     D & C Home care Instructions:   Personal hygiene:  Used sanitary napkins for vaginal drainage not tampons. Shower or tub bathe the day after your procedure. No douching until bleeding stops. Always wipe from front to back after  Elimination.  Activity: Do not drive or operate any equipment today. The effects of the anesthesia are still present and drowsiness may result. Rest today, not necessarily flat bed rest, just take it easy. You may resume your  normal activity in one to 2 days.  Sexual activity: No intercourse for three weeks or as indicated by your physician  Diet: Eat a light diet as desired this evening. You may resume a regular diet tomorrow.  Return to work: One to 2 days.  General Expectations of your surgery: Vaginal bleeding should be no heavier than a normal period. Spotting may continue up to 10 days. Mild cramps may continue for a couple of days. You may have a regular period in 2-6 weeks.  Unexpected observations call your doctor if these occur: persistent or heavy bleeding. Severe abdominal cramping or pain. Elevation of temperature greater than 100F. Inability to urinate.  Call for an appointment in one to two weeks.

## 2022-08-18 NOTE — Anesthesia Procedure Notes (Signed)
Procedure Name: LMA Insertion Date/Time: 08/18/2022 11:50 AM  Performed by: Suan Halter, CRNAPre-anesthesia Checklist: Patient identified, Emergency Drugs available, Suction available and Patient being monitored Patient Re-evaluated:Patient Re-evaluated prior to induction Oxygen Delivery Method: Circle system utilized Preoxygenation: Pre-oxygenation with 100% oxygen Induction Type: IV induction Ventilation: Mask ventilation without difficulty LMA: LMA inserted LMA Size: 4.0 Number of attempts: 1 Airway Equipment and Method: Bite block Placement Confirmation: positive ETCO2 Tube secured with: Tape Dental Injury: Teeth and Oropharynx as per pre-operative assessment

## 2022-08-18 NOTE — Op Note (Signed)
Morgan Sullivan, Morgan Sullivan. MEDICAL RECORD NO: [id number] ACCOUNT NO: [id number] DATE OF BIRTH: 08-29-1981 FACILITY: [facility] LOCATION: [facility] PHYSICIAN: Monia Sabal. Corinna Capra, MD  Operative Report   DATE OF PROCEDURE: 08/18/2022  PREOPERATIVE DIAGNOSIS:  Embryonic demise and incomplete abortion at 65-1/[redacted] week gestational age.  POSTOPERATIVE DIAGNOSIS:  Embryonic demise and incomplete abortion at 36-1/[redacted] week gestational age.  PROCEDURE:  Dilation and evacuation.  SURGEON:  Monia Sabal. Corinna Capra, MD.  ANESTHESIA:  General by LMA and paracervical block.  INDICATIONS:  The patient is a 41 year old with a spontaneous conception.  She presented for ultrasound evaluation showing a 6-1/2 week embryonic demise.  She was offered dilation and evacuation versus expectant management versus misoprostol.  She chose  misoprostol and after three courses of misoprostol still did not have significant event.  She did have bleeding and cramping.  Ultrasound showed no change in the fetal tissue, none had passed.  So at this time she presents for a dilation and evacuation.   Risks and benefits of procedure were discussed at length and informed consent was obtained.  She is Rh positive.  DESCRIPTION OF PROCEDURE:  After adequate analgesia, the patient was placed in the dorsal lithotomy position.  She was sterilely prepped and draped.  Bladder was sterilely drained.  Graves speculum was placed.  Paracervical block was placed with 1%  Xylocaine.  A tenaculum placed on the anterior lip of the cervix.  Uterus sounded to 10 cm in a retroverted position.  It was easily dilated to a #27 Pratt dilator.  An 8 mm suction curette was inserted. Suction curettage was performed until no further  products being retrieved and a gritty surface felt throughout the endometrial cavity.  The patient was given Methergine 0.2 mg IM with good uterine response.  Curette was then removed.  Tenaculum removed from the cervix.  Hemostasis was achieved with  Monsel  solution.  Because of her past history of bleeding after D and E we did give her one gram of TXA. She had minimal bleeding.  Good uterine tone.  The patient was stable and transferred to recovery room.  Sponge and needle count was normal x3.   Estimated blood loss was minimal.  DISPOSITION: The patient will be discharged home with follow up in the office in 3 weeks.  Told to return for increased pain, fever or bleeding.   PUS D: 08/18/2022 12:12:29 pm T: 08/18/2022 1:03:00 pm  JOB: [id number]/ [id number]

## 2022-08-19 ENCOUNTER — Encounter (HOSPITAL_BASED_OUTPATIENT_CLINIC_OR_DEPARTMENT_OTHER): Payer: Self-pay | Admitting: Obstetrics and Gynecology

## 2022-08-19 LAB — SURGICAL PATHOLOGY

## 2022-09-08 ENCOUNTER — Ambulatory Visit (INDEPENDENT_AMBULATORY_CARE_PROVIDER_SITE_OTHER): Payer: Self-pay | Admitting: Dermatology

## 2022-09-08 ENCOUNTER — Encounter: Payer: Self-pay | Admitting: Dermatology

## 2022-09-08 VITALS — BP 130/82 | HR 68

## 2022-09-08 DIAGNOSIS — L988 Other specified disorders of the skin and subcutaneous tissue: Secondary | ICD-10-CM

## 2022-09-08 NOTE — Patient Instructions (Signed)
Due to recent changes in healthcare laws, you may see results of your pathology and/or laboratory studies on MyChart before the doctors have had a chance to review them. We understand that in some cases there may be results that are confusing or concerning to you. Please understand that not all results are received at the same time and often the doctors may need to interpret multiple results in order to provide you with the best plan of care or course of treatment. Therefore, we ask that you please give us 2 business days to thoroughly review all your results before contacting the office for clarification. Should we see a critical lab result, you will be contacted sooner.   If You Need Anything After Your Visit  If you have any questions or concerns for your doctor, please call our main line at [phone] and press option 4 to reach your doctor's medical assistant. If no one answers, please leave a voicemail as directed and we will return your call as soon as possible. Messages left after 4 pm will be answered the following business day.   You may also send us a message via MyChart. We typically respond to MyChart messages within 1-2 business days.  For prescription refills, please ask your pharmacy to contact our office. Our fax number is [phone].  If you have an urgent issue when the clinic is closed that cannot wait until the next business day, you can page your doctor at the number below.    Please note that while we do our best to be available for urgent issues outside of office hours, we are not available 24/7.   If you have an urgent issue and are unable to reach us, you may choose to seek medical care at your doctor's office, retail clinic, urgent care center, or emergency room.  If you have a medical emergency, please immediately call 911 or go to the emergency department.  Pager Numbers  - Dr. Kowalski: [phone]  - Dr. Moye: [phone]  - Dr. Stewart:  [phone]  In the event of inclement weather, please call our main line at [phone] for an update on the status of any delays or closures.  Dermatology Medication Tips: Please keep the boxes that topical medications come in in order to help keep track of the instructions about where and how to use these. Pharmacies typically print the medication instructions only on the boxes and not directly on the medication tubes.   If your medication is too expensive, please contact our office at [phone] option 4 or send us a message through MyChart.   We are unable to tell what your co-pay for medications will be in advance as this is different depending on your insurance coverage. However, we may be able to find a substitute medication at lower cost or fill out paperwork to get insurance to cover a needed medication.   If a prior authorization is required to get your medication covered by your insurance company, please allow us 1-2 business days to complete this process.  Drug prices often vary depending on where the prescription is filled and some pharmacies may offer cheaper prices.  The website www.goodrx.com contains coupons for medications through different pharmacies. The prices here do not account for what the cost may be with help from insurance (it may be cheaper with your insurance), but the website can give you the price if you did not use any insurance.  - You can print the associated coupon and take it with   your prescription to the pharmacy.  - You may also stop by our office during regular business hours and pick up a GoodRx coupon card.  - If you need your prescription sent electronically to a different pharmacy, notify our office through [facility] MyChart or by phone at [phone] option 4.     Si Usted Necesita Algo Despus de Su Visita  Tambin puede enviarnos un mensaje a travs de MyChart. Por lo general respondemos a los mensajes de MyChart en el transcurso de 1 a 2  das hbiles.  Para renovar recetas, por favor pida a su farmacia que se ponga en contacto con nuestra oficina. Nuestro nmero de fax es el [phone].  Si tiene un asunto urgente cuando la clnica est cerrada y que no puede esperar hasta el siguiente da hbil, puede llamar/localizar a su doctor(a) al nmero que aparece a continuacin.   Por favor, tenga en cuenta que aunque hacemos todo lo posible para estar disponibles para asuntos urgentes fuera del horario de oficina, no estamos disponibles las 24 horas del da, los 7 das de la semana.   Si tiene un problema urgente y no puede comunicarse con nosotros, puede optar por buscar atencin mdica  en el consultorio de su doctor(a), en una clnica privada, en un centro de atencin urgente o en una sala de emergencias.  Si tiene una emergencia mdica, por favor llame inmediatamente al 911 o vaya a la sala de emergencias.  Nmeros de bper  - Dr. Kowalski: [phone]  - Dra. Moye: [phone]  - Dra. Stewart: [phone]  En caso de inclemencias del tiempo, por favor llame a nuestra lnea principal al [phone] para una actualizacin sobre el estado de cualquier retraso o cierre.  Consejos para la medicacin en dermatologa: Por favor, guarde las cajas en las que vienen los medicamentos de uso tpico para ayudarle a seguir las instrucciones sobre dnde y cmo usarlos. Las farmacias generalmente imprimen las instrucciones del medicamento slo en las cajas y no directamente en los tubos del medicamento.   Si su medicamento es muy caro, por favor, pngase en contacto con nuestra oficina llamando al [phone] y presione la opcin 4 o envenos un mensaje a travs de MyChart.   No podemos decirle cul ser su copago por los medicamentos por adelantado ya que esto es diferente dependiendo de la cobertura de su seguro. Sin embargo, es posible que podamos encontrar un medicamento sustituto a menor costo o llenar un formulario para que el  seguro cubra el medicamento que se considera necesario.   Si se requiere una autorizacin previa para que su compaa de seguros cubra su medicamento, por favor permtanos de 1 a 2 das hbiles para completar este proceso.  Los precios de los medicamentos varan con frecuencia dependiendo del lugar de dnde se surte la receta y alguna farmacias pueden ofrecer precios ms baratos.  El sitio web www.goodrx.com tiene cupones para medicamentos de diferentes farmacias. Los precios aqu no tienen en cuenta lo que podra costar con la ayuda del seguro (puede ser ms barato con su seguro), pero el sitio web puede darle el precio si no utiliz ningn seguro.  - Puede imprimir el cupn correspondiente y llevarlo con su receta a la farmacia.  - Tambin puede pasar por nuestra oficina durante el horario de atencin regular y recoger una tarjeta de cupones de GoodRx.  - Si necesita que su receta se enve electrnicamente a una farmacia diferente, informe a nuestra oficina a travs de MyChart de [place]   o por telfono llamando al [phone] y presione la opcin 4.  

## 2022-09-08 NOTE — Progress Notes (Signed)
   Follow-Up Visit   Subjective  Morgan Sullivan is a 41 y.o. female who presents for the following: Facial Elastosis (Here for Botox).  The following portions of the chart were reviewed this encounter and updated as appropriate:  Tobacco  Allergies  Meds  Problems  Med Hx  Surg Hx  Fam Hx     Review of Systems: No other skin or systemic complaints except as noted in HPI or Assessment and Plan.  Objective  Well appearing patient in no apparent distress; mood and affect are within normal limits.  A focused examination was performed including face. Relevant physical exam findings are noted in the Assessment and Plan.  face Rhytides and volume loss.       Assessment & Plan  Elastosis of skin face  Botox 21.5 units injected as marked:  - Frown complex 17.5 units - Forehead 4 units  Botox Injection - face Location: See attached image  Informed consent: Discussed risks (infection, pain, bleeding, bruising, swelling, allergic reaction, paralysis of nearby muscles, eyelid droop, double vision, neck weakness, difficulty breathing, headache, undesirable cosmetic result, and need for additional treatment) and benefits of the procedure, as well as the alternatives.  Informed consent was obtained.  Preparation: The area was cleansed with alcohol.  Procedure Details:  Botox was injected into the dermis with a 30-gauge needle. Pressure applied to any bleeding. Ice packs offered for swelling.  Lot Number:  [id number] Expiration:  10/2024  Total Units Injected:  21.5  Plan: Patient was instructed to remain upright for 4 hours. Patient was instructed to avoid massaging the face and avoid vigorous exercise for the rest of the day. Tylenol may be used for headache.  Allow 2 weeks before returning to clinic for additional dosing as needed. Patient will call for any problems.    Return for Botox 3-4 months.  I, Emelia Salisbury, CMA, am acting as scribe for Sarina Ser,  MD. Documentation: I have reviewed the above documentation for accuracy and completeness, and I agree with the above.  Sarina Ser, MD

## 2022-09-18 ENCOUNTER — Encounter: Payer: Self-pay | Admitting: Dermatology

## 2023-01-05 ENCOUNTER — Ambulatory Visit (INDEPENDENT_AMBULATORY_CARE_PROVIDER_SITE_OTHER): Payer: Self-pay | Admitting: Dermatology

## 2023-01-05 VITALS — BP 132/85 | HR 62

## 2023-01-05 DIAGNOSIS — L988 Other specified disorders of the skin and subcutaneous tissue: Secondary | ICD-10-CM

## 2023-01-05 NOTE — Patient Instructions (Signed)
Due to recent changes in healthcare laws, you may see results of your pathology and/or laboratory studies on MyChart before the doctors have had a chance to review them. We understand that in some cases there may be results that are confusing or concerning to you. Please understand that not all results are received at the same time and often the doctors may need to interpret multiple results in order to provide you with the best plan of care or course of treatment. Therefore, we ask that you please give us 2 business days to thoroughly review all your results before contacting the office for clarification. Should we see a critical lab result, you will be contacted sooner.   If You Need Anything After Your Visit  If you have any questions or concerns for your doctor, please call our main line at [phone] and press option 4 to reach your doctor's medical assistant. If no one answers, please leave a voicemail as directed and we will return your call as soon as possible. Messages left after 4 pm will be answered the following business day.   You may also send us a message via MyChart. We typically respond to MyChart messages within 1-2 business days.  For prescription refills, please ask your pharmacy to contact our office. Our fax number is [phone].  If you have an urgent issue when the clinic is closed that cannot wait until the next business day, you can page your doctor at the number below.    Please note that while we do our best to be available for urgent issues outside of office hours, we are not available 24/7.   If you have an urgent issue and are unable to reach us, you may choose to seek medical care at your doctor's office, retail clinic, urgent care center, or emergency room.  If you have a medical emergency, please immediately call 911 or go to the emergency department.  Pager Numbers  - Dr. Kowalski: [phone]  - Dr. Moye: [phone]  - Dr. Stewart:  [phone]  In the event of inclement weather, please call our main line at [phone] for an update on the status of any delays or closures.  Dermatology Medication Tips: Please keep the boxes that topical medications come in in order to help keep track of the instructions about where and how to use these. Pharmacies typically print the medication instructions only on the boxes and not directly on the medication tubes.   If your medication is too expensive, please contact our office at [phone] option 4 or send us a message through MyChart.   We are unable to tell what your co-pay for medications will be in advance as this is different depending on your insurance coverage. However, we may be able to find a substitute medication at lower cost or fill out paperwork to get insurance to cover a needed medication.   If a prior authorization is required to get your medication covered by your insurance company, please allow us 1-2 business days to complete this process.  Drug prices often vary depending on where the prescription is filled and some pharmacies may offer cheaper prices.  The website www.goodrx.com contains coupons for medications through different pharmacies. The prices here do not account for what the cost may be with help from insurance (it may be cheaper with your insurance), but the website can give you the price if you did not use any insurance.  - You can print the associated coupon and take it with   your prescription to the pharmacy.  - You may also stop by our office during regular business hours and pick up a GoodRx coupon card.  - If you need your prescription sent electronically to a different pharmacy, notify our office through [facility] MyChart or by phone at [phone] option 4.     Si Usted Necesita Algo Despus de Su Visita  Tambin puede enviarnos un mensaje a travs de MyChart. Por lo general respondemos a los mensajes de MyChart en el transcurso de 1 a 2  das hbiles.  Para renovar recetas, por favor pida a su farmacia que se ponga en contacto con nuestra oficina. Nuestro nmero de fax es el [phone].  Si tiene un asunto urgente cuando la clnica est cerrada y que no puede esperar hasta el siguiente da hbil, puede llamar/localizar a su doctor(a) al nmero que aparece a continuacin.   Por favor, tenga en cuenta que aunque hacemos todo lo posible para estar disponibles para asuntos urgentes fuera del horario de oficina, no estamos disponibles las 24 horas del da, los 7 das de la semana.   Si tiene un problema urgente y no puede comunicarse con nosotros, puede optar por buscar atencin mdica  en el consultorio de su doctor(a), en una clnica privada, en un centro de atencin urgente o en una sala de emergencias.  Si tiene una emergencia mdica, por favor llame inmediatamente al 911 o vaya a la sala de emergencias.  Nmeros de bper  - Dr. Kowalski: [phone]  - Dra. Moye: [phone]  - Dra. Stewart: [phone]  En caso de inclemencias del tiempo, por favor llame a nuestra lnea principal al [phone] para una actualizacin sobre el estado de cualquier retraso o cierre.  Consejos para la medicacin en dermatologa: Por favor, guarde las cajas en las que vienen los medicamentos de uso tpico para ayudarle a seguir las instrucciones sobre dnde y cmo usarlos. Las farmacias generalmente imprimen las instrucciones del medicamento slo en las cajas y no directamente en los tubos del medicamento.   Si su medicamento es muy caro, por favor, pngase en contacto con nuestra oficina llamando al [phone] y presione la opcin 4 o envenos un mensaje a travs de MyChart.   No podemos decirle cul ser su copago por los medicamentos por adelantado ya que esto es diferente dependiendo de la cobertura de su seguro. Sin embargo, es posible que podamos encontrar un medicamento sustituto a menor costo o llenar un formulario para que el  seguro cubra el medicamento que se considera necesario.   Si se requiere una autorizacin previa para que su compaa de seguros cubra su medicamento, por favor permtanos de 1 a 2 das hbiles para completar este proceso.  Los precios de los medicamentos varan con frecuencia dependiendo del lugar de dnde se surte la receta y alguna farmacias pueden ofrecer precios ms baratos.  El sitio web www.goodrx.com tiene cupones para medicamentos de diferentes farmacias. Los precios aqu no tienen en cuenta lo que podra costar con la ayuda del seguro (puede ser ms barato con su seguro), pero el sitio web puede darle el precio si no utiliz ningn seguro.  - Puede imprimir el cupn correspondiente y llevarlo con su receta a la farmacia.  - Tambin puede pasar por nuestra oficina durante el horario de atencin regular y recoger una tarjeta de cupones de GoodRx.  - Si necesita que su receta se enve electrnicamente a una farmacia diferente, informe a nuestra oficina a travs de MyChart de [facility]   o por telfono llamando al [phone] y presione la opcin 4.  

## 2023-01-05 NOTE — Progress Notes (Signed)
   Follow-Up Visit   Subjective  Morgan Sullivan is a 42 y.o. female who presents for the following: Botox for facial elastosis  The following portions of the chart were reviewed this encounter and updated as appropriate: medications, allergies, medical history  Review of Systems:  No other skin or systemic complaints except as noted in HPI or Assessment and Plan.  Objective  Well appearing patient in no apparent distress; mood and affect are within normal limits.  A focused examination was performed of the face.  Relevant physical exam findings are noted in the Assessment and Plan.    Assessment & Plan   Botox 21.5 units injected as marked: - Frown complex 17.5 units - Forehead 4 units (all forehead injections 3.0 cm above the brow)  Facial Elastosis  Location: See attached image  Informed consent: Discussed risks (infection, pain, bleeding, bruising, swelling, allergic reaction, paralysis of nearby muscles, eyelid droop, double vision, neck weakness, difficulty breathing, headache, undesirable cosmetic result, and need for additional treatment) and benefits of the procedure, as well as the alternatives.  Informed consent was obtained.  Preparation: The area was cleansed with alcohol.  Procedure Details:  Botox was injected into the dermis with a 30-gauge needle. Pressure applied to any bleeding. Ice packs offered for swelling.  Lot Number:  [id number] Expiration:  04/26  Total Units Injected:  21.5  Plan: Tylenol may be used for headache.  Allow 2 weeks before returning to clinic for additional dosing as needed. Patient will call for any problems.  Return in about 3 months (around 04/06/2023) for Botox injections.  Maylene Roes, CMA, am acting as scribe for Armida Sans, MD .  Documentation: I have reviewed the above documentation for accuracy and completeness, and I agree with the above.  Armida Sans, MD

## 2023-01-18 ENCOUNTER — Encounter: Payer: Self-pay | Admitting: Dermatology

## 2023-12-22 ENCOUNTER — Encounter: Payer: Self-pay | Admitting: Dermatology

## 2023-12-22 ENCOUNTER — Ambulatory Visit (INDEPENDENT_AMBULATORY_CARE_PROVIDER_SITE_OTHER): Payer: Self-pay | Admitting: Dermatology

## 2023-12-22 DIAGNOSIS — L988 Other specified disorders of the skin and subcutaneous tissue: Secondary | ICD-10-CM

## 2023-12-22 NOTE — Patient Instructions (Signed)

## 2023-12-22 NOTE — Progress Notes (Signed)
   Follow-Up Visit   Subjective  Abagael D Demelo is a 43 y.o. female who presents for the following: Botox for facial elastosis  The following portions of the chart were reviewed this encounter and updated as appropriate: medications, allergies, medical history  Review of Systems:  No other skin or systemic complaints except as noted in HPI or Assessment and Plan.  Objective  Well appearing patient in no apparent distress; mood and affect are within normal limits.  A focused examination was performed of the face.  Relevant physical exam findings are noted in the Assessment and Plan.  Injection map photo     Assessment & Plan    Facial Elastosis Botox 21.5 units injected as marked: - Frown complex 17.5 units - Forehead 4 units (all forehead injections 3.0-4.0 cm above the brow)  Location: frown complex, forehead  Informed consent: Discussed risks (infection, pain, bleeding, bruising, swelling, allergic reaction, paralysis of nearby muscles, eyelid droop, double vision, neck weakness, difficulty breathing, headache, undesirable cosmetic result, and need for additional treatment) and benefits of the procedure, as well as the alternatives.  Informed consent was obtained.  Preparation: The area was cleansed with alcohol.  Procedure Details:  Botox was injected into the dermis with a 30-gauge needle. Pressure applied to any bleeding. Ice packs offered for swelling.  Lot Number:  [id number] Expiration:  10/2025  Total Units Injected:  21.5  Plan: Tylenol may be used for headache.  Allow 2 weeks before returning to clinic for additional dosing as needed. Patient will call for any problems.  Return for 3-38m Botox.  I, Ardis Rowan, RMA, am acting as scribe for Armida Sans, MD .   Documentation: I have reviewed the above documentation for accuracy and completeness, and I agree with the above.  Armida Sans, MD

## 2024-04-05 ENCOUNTER — Ambulatory Visit: Admitting: Dermatology

## 2024-04-11 ENCOUNTER — Ambulatory Visit (INDEPENDENT_AMBULATORY_CARE_PROVIDER_SITE_OTHER): Admitting: Dermatology

## 2024-04-11 DIAGNOSIS — L988 Other specified disorders of the skin and subcutaneous tissue: Secondary | ICD-10-CM

## 2024-04-11 NOTE — Progress Notes (Unsigned)
   Follow-Up Visit   Subjective  Morgan Sullivan is a 43 y.o. female who presents for the following: Botox for facial elastosis  The following portions of the chart were reviewed this encounter and updated as appropriate: medications, allergies, medical history  Review of Systems:  No other skin or systemic complaints except as noted in HPI or Assessment and Plan.  Objective  Well appearing patient in no apparent distress; mood and affect are within normal limits.  A focused examination was performed of the face.  Relevant physical exam findings are noted in the Assessment and Plan.     Assessment & Plan    Facial Elastosis Botox 21.5 units injected as marked: - Frown complex 17.5 units - Forehead 4 units ( forehead injections- left forehead lateral 3 cm, left forehead medial 3.5 cm  above the left brow and right forehead lateral 3 cm and right forehead medial 3.5 cm above the right brow)   Location: frown complex, forehead  Informed consent: Discussed risks (infection, pain, bleeding, bruising, swelling, allergic reaction, paralysis of nearby muscles, eyelid droop, double vision, neck weakness, difficulty breathing, headache, undesirable cosmetic result, and need for additional treatment) and benefits of the procedure, as well as the alternatives.  Informed consent was obtained.  Preparation: The area was cleansed with alcohol.  Procedure Details:  Botox was injected into the dermis with a 30-gauge needle. Pressure applied to any bleeding. Ice packs offered for swelling.  Lot Number:  [id number] Expiration:  01/19/2026  Total Units Injected:  21.5  Plan: Tylenol  may be used for headache.  Allow 2 weeks before returning to clinic for additional dosing as needed. Patient will call for any problems.  Return in about 3 weeks (around 05/02/2024) for recheck Botox .  IFay Kirks, CMA, am acting as scribe for [name], MD .   Documentation: I have reviewed the above  documentation for accuracy and completeness, and I agree with the above.  [name], MD

## 2024-04-11 NOTE — Patient Instructions (Signed)

## 2024-04-12 ENCOUNTER — Encounter: Payer: Self-pay | Admitting: Dermatology

## 2024-05-02 ENCOUNTER — Ambulatory Visit: Admitting: Dermatology

## 2024-06-10 LAB — COLOGUARD: COLOGUARD: NEGATIVE

## 2024-07-17 ENCOUNTER — Ambulatory Visit

## 2024-07-17 DIAGNOSIS — R238 Other skin changes: Secondary | ICD-10-CM

## 2024-07-17 NOTE — Patient Instructions (Signed)

## 2024-07-17 NOTE — Progress Notes (Signed)
    Subjective   Morgan Sullivan is a 43 y.o. female who presents for the following: Lesion(s) of concern . Patient is established patient   Today patient reports: Area of concern on her left lower lip; lesion has been lasered in the past and returned shortly after. Patient states it darkened and gotten bigger recently.   Review of Systems:    No other skin or systemic complaints except as noted in HPI or Assessment and Plan.  The following portions of the chart were reviewed this encounter and updated as appropriate: medications, allergies, medical history  Relevant Medical History:  n/a   Objective  Well appearing patient in no apparent distress; mood and affect are within normal limits. Examination was performed of the: Focused Exam of: Left lower vermilion lip   Examination notable for: venous lake L lower lip  Examination limited by: Clothing and Patient deferred removal       Assessment & Plan   Venous lake L lower lip - pt under impression she was getting laser tx today. Advised visit was scheduled as lesion of concern.  - message sent to schedule laser w/ Dr. Hester   - no charge - do not bill   Level of service outlined above   Procedures, orders, diagnosis for this visit:    There are no diagnoses linked to this encounter.  Return to clinic: Return for Appointment as scheduled.  I, Emerick Ege, CMA am acting as scribe for Lauraine JAYSON Kanaris, MD   Documentation: I have reviewed the above documentation for accuracy and completeness, and I agree with the above.  Lauraine JAYSON Kanaris, MD

## 2024-08-09 ENCOUNTER — Ambulatory Visit (INDEPENDENT_AMBULATORY_CARE_PROVIDER_SITE_OTHER): Payer: Self-pay | Admitting: Dermatology

## 2024-08-09 DIAGNOSIS — L814 Other melanin hyperpigmentation: Secondary | ICD-10-CM

## 2024-08-09 DIAGNOSIS — R238 Other skin changes: Secondary | ICD-10-CM

## 2024-08-09 DIAGNOSIS — L578 Other skin changes due to chronic exposure to nonionizing radiation: Secondary | ICD-10-CM

## 2024-08-09 NOTE — Progress Notes (Signed)
   Follow-Up Visit   Subjective  Morgan Sullivan is a 43 y.o. female who presents for the following: BBL of the face and venous lake of the lower lip. Patient states that she has had BBL in the past for the face and lower lip, but it has been many years ago.  The following portions of the chart were reviewed this encounter and updated as appropriate: medications, allergies, medical history  Review of Systems:  No other skin or systemic complaints except as noted in HPI or Assessment and Plan.  Objective  Well appearing patient in no apparent distress; mood and affect are within normal limits.   A focused examination was performed of the following areas: the face and lips   Relevant exam findings are noted in the Assessment and Plan.  Face and lips Diffuse scaly erythematous macules with underlying dyspigmentation.   Assessment & Plan                       ACTINIC SKIN DAMAGE Face and lips With a venous lake of the left lower lip Photorejuvenation - Face and lips Prior to the procedure, the patient's past medical history, medications, allergies, and the rare but potential risks and complications were reviewed with the patient and a signed consent was obtained.  Pre and post treatment care was discussed and instructions provided.   [place] BBL - 08/09/24 1600      Patient Details   Current Skin Care: None    Skin Type: II    Anesthestic Cream Applied: No    Photo Takes: Yes    Consent Signed: Yes    Improvement from Previous Treatment: No      Treatment Details   Date: 08/09/24    Treatment #: 1    Area: Face and L lower lip    Filter: 1st Pass;2nd Pass;3rd Pass      1st Pass   Location: L lower lip  Device: 560    BBL j/cm2: 28    PW Msec Sec: 30    Cooling Temp: 20    Pulses: 3    7mm: yes      2nd Pass   Location: F   spot treatment   Device: 515    BBL j/cm2: 15    PW Msec Sec: 15    Cooling Temp: 15    Pulses: 41    11mm: yes       3rd Pass   Location: F   full face   Device: 515    BBL j/cm2: 12    PW Msec Sec: 10    Cooling Temp: 25    Pulses: 138    15x15: yes        Patient tolerated the procedure well.   [place] avoidance was stressed. The patient will call with any problems, questions or concerns prior to their next appointment.  Laser kit for 1st treatment and face cooling mask given to patient at this visit.   Return for appointment as scheduled.  LILLETTE Rosina Mayans, CMA, am acting as scribe for [name], MD .  Documentation: I have reviewed the above documentation for accuracy and completeness, and I agree with the above.  [name], MD

## 2024-08-09 NOTE — Patient Instructions (Signed)

## 2024-08-13 ENCOUNTER — Encounter: Payer: Self-pay | Admitting: Dermatology

## 2024-09-12 ENCOUNTER — Ambulatory Visit (INDEPENDENT_AMBULATORY_CARE_PROVIDER_SITE_OTHER): Payer: Self-pay | Admitting: Dermatology

## 2024-09-12 ENCOUNTER — Encounter: Payer: Self-pay | Admitting: Dermatology

## 2024-09-12 DIAGNOSIS — L988 Other specified disorders of the skin and subcutaneous tissue: Secondary | ICD-10-CM

## 2024-09-12 NOTE — Patient Instructions (Signed)

## 2024-09-12 NOTE — Progress Notes (Signed)
" ° °  Follow-Up Visit   Subjective  Morgan Sullivan is a 43 y.o. female who presents for the following: Botox for facial elastosis  The following portions of the chart were reviewed this encounter and updated as appropriate: medications, allergies, medical history  Review of Systems:  No other skin or systemic complaints except as noted in HPI or Assessment and Plan.  Objective  Well appearing patient in no apparent distress; mood and affect are within normal limits.  A focused examination was performed of the face.  Relevant physical exam findings are noted in the Assessment and Plan.     Assessment & Plan    Facial Elastosis  Botox 21.5 units injected as marked: -Frown complex 17.5 units -Forehead 4 units (1 unit each)  Discussed adding more Botox to the forehead, but patient experienced brow drooping with increased amounts.   Discussed adding an additional 7.5 units to each crow's feet for a total of 15 units.  Location: See attached image  Informed consent: Discussed risks (infection, pain, bleeding, bruising, swelling, allergic reaction, paralysis of nearby muscles, eyelid droop, double vision, neck weakness, difficulty breathing, headache, undesirable cosmetic result, and need for additional treatment) and benefits of the procedure, as well as the alternatives.  Informed consent was obtained.  Preparation: The area was cleansed with alcohol.  Procedure Details:  Botox was injected into the dermis with a 30-gauge needle. Pressure applied to any bleeding. Ice packs offered for swelling.  Lot Number:  [id number] Expiration:  07/2026  Total Units Injected:  21.5  Plan: Tylenol  may be used for headache.  Allow 2 weeks before returning to clinic for additional dosing as needed. Patient will call for any problems.  Return in about 4 months (around 01/11/2025) for botox injections.  LILLETTE Rosina Mayans, CMA, am acting as scribe for [name], MD .   Documentation: I have  reviewed the above documentation for accuracy and completeness, and I agree with the above.  [name], MD      "

## 2025-01-16 ENCOUNTER — Ambulatory Visit: Admitting: Dermatology
